# Patient Record
Sex: Male | Born: 1970 | Hispanic: Yes | Marital: Married | Smoking: Never smoker
Health system: Southern US, Community
[De-identification: ages and names within clinical notes are randomized; demographics above are authoritative.]

## PROBLEM LIST (undated history)

## (undated) DIAGNOSIS — F32A Depression, unspecified: Secondary | ICD-10-CM

## (undated) DIAGNOSIS — R58 Hemorrhage, not elsewhere classified: Secondary | ICD-10-CM

## (undated) DIAGNOSIS — R519 Headache, unspecified: Secondary | ICD-10-CM

## (undated) DIAGNOSIS — R309 Painful micturition, unspecified: Secondary | ICD-10-CM

## (undated) DIAGNOSIS — D649 Anemia, unspecified: Secondary | ICD-10-CM

## (undated) DIAGNOSIS — K219 Gastro-esophageal reflux disease without esophagitis: Secondary | ICD-10-CM

## (undated) DIAGNOSIS — E119 Type 2 diabetes mellitus without complications: Secondary | ICD-10-CM

## (undated) DIAGNOSIS — F419 Anxiety disorder, unspecified: Secondary | ICD-10-CM

## (undated) DIAGNOSIS — R351 Nocturia: Secondary | ICD-10-CM

## (undated) DIAGNOSIS — I10 Essential (primary) hypertension: Secondary | ICD-10-CM

## (undated) HISTORY — DX: Headache, unspecified: R51.9

## (undated) HISTORY — DX: Hemorrhage, not elsewhere classified: R58

## (undated) HISTORY — DX: Gastro-esophageal reflux disease without esophagitis: K21.9

## (undated) HISTORY — DX: Essential (primary) hypertension: I10

## (undated) HISTORY — DX: Anxiety disorder, unspecified: F41.9

## (undated) HISTORY — DX: Anemia, unspecified: D64.9

## (undated) HISTORY — DX: Depression, unspecified: F32.A

## (undated) HISTORY — DX: Nocturia: R35.1

## (undated) HISTORY — DX: Painful micturition, unspecified: R30.9

## (undated) HISTORY — DX: Type 2 diabetes mellitus without complications: E11.9

---

## 2014-03-05 ENCOUNTER — Telehealth: Payer: Self-pay | Admitting: *Deleted

## 2014-03-05 ENCOUNTER — Ambulatory Visit: Payer: Self-pay | Admitting: Podiatry

## 2014-03-05 ENCOUNTER — Encounter: Payer: Self-pay | Admitting: Podiatry

## 2014-03-05 VITALS — BP 121/77 | HR 58 | Resp 16 | Ht 65.0 in | Wt 180.0 lb

## 2014-03-05 DIAGNOSIS — B353 Tinea pedis: Secondary | ICD-10-CM

## 2014-03-05 DIAGNOSIS — B351 Tinea unguium: Secondary | ICD-10-CM

## 2014-03-05 DIAGNOSIS — L600 Ingrowing nail: Secondary | ICD-10-CM

## 2014-03-05 MED ORDER — TERBINAFINE HCL 250 MG PO TABS: 250.0000 mg | ORAL_TABLET | Freq: Every day | ORAL | Status: DC

## 2014-03-05 MED ORDER — NEOMYCIN-POLYMYXIN-HC 1 % OT SOLN: OTIC | Status: DC

## 2014-03-05 NOTE — Progress Notes (Signed)
   Subjective:    Patient ID: Darrell Turner, male    DOB: 11/14/1970, 43 y.o.   MRN: [id number]  HPI Comments: "He has yellow nails"  Patient presents with wife  Patient c/o thick, discolored toenails for several years. First toes bilateral are ingrown along the medial border. Those areas get red and swollen and very painful. He has tried several OTC antifungals-no help.      Review of Systems  Skin:       Change in nails  All other systems reviewed and are negative.      Objective:   Physical Exam: I have reviewed his past medical history medications allergies surgeries social history and review of systems. Pulses are strongly palpable bilateral deep tendon reflexes are intact bilateral neurologic sensorium is intact per since once the monofilament. Muscle strength is 5 over 5 dorsiflexors plantar flexors inverters everters all intrinsic musculature appears to be intact. Orthopedic evaluation demonstrates all joints distal to the ankle a full range of motion without crepitation. He does have sharp incurvated nail margin along the tibial border of the hallux bilateral. He also has thick yellow dystrophic clinically mycotic nails which are painful for him. Tinea pedis bilateral.        Assessment & Plan:  Assessment: Ingrown nail paronychia hallux bilateral. Onychomycosis bilateral. Tinea pedis bilateral.  Plan: Discussed etiology pathology conservative versus surgical therapies. At this point we performed a chemical matrixectomy to the tibial border of the hallux bilaterally after local anesthetic was administered. He tolerated this procedure well and phenol was utilized. He was her soaking on a twice a day basis and Betadine water starting tomorrow. She will apply Cortisporin otic twice daily and cover twice daily. I wrote a prescription for Lamisil 250 mg once daily. I will followup with him in one week

## 2014-03-05 NOTE — Telephone Encounter (Signed)
Calling to verify a prescription that was sent to us.  It's for cortisporin otic solution, it's an ear drop but instructions say to apply to the toe.  Is this correct?  I informed her yes, it's correct.

## 2014-03-05 NOTE — Patient Instructions (Addendum)
Betadine Soak Instructions  Purchase an 8 oz. bottle of BETADINE solution (Povidone)  THE DAY AFTER THE PROCEDURE  Place 1 tablespoon of betadine solution in a quart of warm tap water.  Submerge your foot or feet with outer bandage intact for the initial soak; this will allow the bandage to become moist and wet for easy lift off.  Once you remove your bandage, continue to soak in the solution for 20 minutes.  This soak should be done twice a day.  Next, remove your foot or feet from solution, blot dry the affected area and cover.  You may use a band aid large enough to cover the area or use gauze and tape.  Apply other medications to the area as directed by the doctor such as cortisporin otic solution (ear drops) or neosporin.  IF YOUR SKIN BECOMES IRRITATED WHILE USING THESE INSTRUCTIONS, IT IS OKAY TO SWITCH TO EPSOM SALTS AND WATER OR WHITE VINEGAR AND WATER.   Terbinafine tablets What is this medicine? TERBINAFINE (TER bin a feen) is an antifungal medicine. It is used to treat certain kinds of fungal or yeast infections. This medicine may be used for other purposes; ask your health care provider or pharmacist if you have questions. COMMON BRAND NAME(S): Lamisil, Terbinex What should I tell my health care provider before I take this medicine? They need to know if you have any of these conditions: -drink alcoholic beverages -kidney disease -liver disease -an unusual or allergic reaction to terbinafine, other medicines, foods, dyes, or preservatives -pregnant or trying to get pregnant -breast-feeding How should I use this medicine? Take this medicine by mouth with a full glass of water. Follow the directions on the prescription label. You can take this medicine with food or on an empty stomach. Take your medicine at regular intervals. Do not take your medicine more often than directed. Do not skip doses or stop your medicine early even if you feel better. Do not stop taking except on your  doctor's advice. Talk to your pediatrician regarding the use of this medicine in children. Special care may be needed. Overdosage: If you think you have taken too much of this medicine contact a poison control center or emergency room at once. NOTE: This medicine is only for you. Do not share this medicine with others. What if I miss a dose? If you miss a dose, take it as soon as you can. If it is almost time for your next dose, take only that dose. Do not take double or extra doses. What may interact with this medicine? Do not take this medicine with any of the following medications: -thioridazine This medicine may also interact with the following medications: -beta-blockers -caffeine -cimetidine -cyclosporine -medicines for depression, anxiety, or psychotic disturbances -medicines for fungal infections like fluconazole and ketoconazole -medicines for irregular heartbeat like amiodarone, flecainide and propafenone -rifampin -warfarin This list may not describe all possible interactions. Give your health care provider a list of all the medicines, herbs, non-prescription drugs, or dietary supplements you use. Also tell them if you smoke, drink alcohol, or use illegal drugs. Some items may interact with your medicine. What should I watch for while using this medicine? Visit your doctor or health care provider regularly. Tell your doctor right away if you have nausea or vomiting, loss of appetite, stomach pain on your right upper side, yellow skin, dark urine, light stools, or are over tired. Some fungal infections need many weeks or months of treatment to cure. If you  are taking this medicine for a long time, you will need to have important blood work done. What side effects may I notice from receiving this medicine? Side effects that you should report to your doctor or health care professional as soon as possible: -allergic reactions like skin rash or hives, swelling of the face, lips, or  tongue -changes in vision -dark urine -fever or infection -general ill feeling or flu-like symptoms -light-colored stools -loss of appetite, nausea -redness, blistering, peeling or loosening of the skin, including inside the mouth -right upper belly pain -unusually weak or tired -yellowing of the eyes or skin Side effects that usually do not require medical attention (report to your doctor or health care professional if they continue or are bothersome): -changes in taste -diarrhea -hair loss -muscle or joint pain -stomach gas -stomach upset This list may not describe all possible side effects. Call your doctor for medical advice about side effects. You may report side effects to FDA at 1-800-FDA-1088. Where should I keep my medicine? Keep out of the reach of children. Store at room temperature below 25 degrees C (77 degrees F). Protect from light. Throw away any unused medicine after the expiration date. NOTE: This sheet is a summary. It may not cover all possible information. If you have questions about this medicine, talk to your doctor, pharmacist, or health care provider.  2014, [employer] Standard. (2007-12-27 16:28:07)

## 2014-03-12 ENCOUNTER — Encounter (HOSPITAL_COMMUNITY): Payer: Self-pay | Admitting: Emergency Medicine

## 2014-03-12 DIAGNOSIS — K3580 Unspecified acute appendicitis: Principal | ICD-10-CM | POA: Insufficient documentation

## 2014-03-12 LAB — COMPREHENSIVE METABOLIC PANEL
ALK PHOS: 74 U/L (ref 39–117)
ALT: 28 U/L (ref 0–53)
AST: 24 U/L (ref 0–37)
Albumin: 4.1 g/dL (ref 3.5–5.2)
BUN: 16 mg/dL (ref 6–23)
CO2: 22 meq/L (ref 19–32)
Calcium: 9.6 mg/dL (ref 8.4–10.5)
Chloride: 98 mEq/L (ref 96–112)
Creatinine, Ser: 0.99 mg/dL (ref 0.50–1.35)
GLUCOSE: 118 mg/dL — AB (ref 70–99)
Potassium: 3.8 mEq/L (ref 3.7–5.3)
SODIUM: 136 meq/L — AB (ref 137–147)
Total Bilirubin: 0.5 mg/dL (ref 0.3–1.2)
Total Protein: 7.7 g/dL (ref 6.0–8.3)

## 2014-03-12 LAB — URINALYSIS, ROUTINE W REFLEX MICROSCOPIC
Bilirubin Urine: NEGATIVE
GLUCOSE, UA: NEGATIVE mg/dL
HGB URINE DIPSTICK: NEGATIVE
Ketones, ur: NEGATIVE mg/dL
Leukocytes, UA: NEGATIVE
Nitrite: NEGATIVE
Protein, ur: NEGATIVE mg/dL
SPECIFIC GRAVITY, URINE: 1.027 (ref 1.005–1.030)
Urobilinogen, UA: 1 mg/dL (ref 0.0–1.0)
pH: 5.5 (ref 5.0–8.0)

## 2014-03-12 LAB — CBC WITH DIFFERENTIAL/PLATELET
BASOS ABS: 0 10*3/uL (ref 0.0–0.1)
Basophils Relative: 0 % (ref 0–1)
EOS ABS: 0.1 10*3/uL (ref 0.0–0.7)
EOS PCT: 1 % (ref 0–5)
HEMATOCRIT: 48.5 % (ref 39.0–52.0)
Hemoglobin: 17.3 g/dL — ABNORMAL HIGH (ref 13.0–17.0)
LYMPHS PCT: 12 % (ref 12–46)
Lymphs Abs: 2.4 10*3/uL (ref 0.7–4.0)
MCH: 31.6 pg (ref 26.0–34.0)
MCHC: 35.7 g/dL (ref 30.0–36.0)
MCV: 88.7 fL (ref 78.0–100.0)
MONO ABS: 0.7 10*3/uL (ref 0.1–1.0)
Monocytes Relative: 4 % (ref 3–12)
Neutro Abs: 17.3 10*3/uL — ABNORMAL HIGH (ref 1.7–7.7)
Neutrophils Relative %: 83 % — ABNORMAL HIGH (ref 43–77)
Platelets: 244 10*3/uL (ref 150–400)
RBC: 5.47 MIL/uL (ref 4.22–5.81)
RDW: 12.5 % (ref 11.5–15.5)
WBC: 20.6 10*3/uL — ABNORMAL HIGH (ref 4.0–10.5)

## 2014-03-12 LAB — LIPASE, BLOOD: Lipase: 22 U/L (ref 11–59)

## 2014-03-12 NOTE — ED Notes (Signed)
Pt c/o right lower abdominal pain. Pt states he was mowing lawns today when the pain started. Pt reports some nausea, no dysuria, tender to palpation, pain radiates to right lower back, last BM today and normal

## 2014-03-13 ENCOUNTER — Encounter (HOSPITAL_COMMUNITY): Payer: Medicaid Other | Admitting: Anesthesiology

## 2014-03-13 ENCOUNTER — Observation Stay (HOSPITAL_COMMUNITY)
Admission: EM | Admit: 2014-03-13 | Discharge: 2014-03-14 | Disposition: A | Discharge status: Home / Self Care | Payer: Medicaid Other | Attending: General Surgery | Admitting: General Surgery

## 2014-03-13 ENCOUNTER — Encounter (HOSPITAL_COMMUNITY)
Admission: EM | Disposition: A | Discharge status: Home / Self Care | Payer: Self-pay | Source: Home / Self Care | Attending: Emergency Medicine

## 2014-03-13 ENCOUNTER — Encounter (HOSPITAL_COMMUNITY): Payer: Self-pay | Admitting: Radiology

## 2014-03-13 ENCOUNTER — Emergency Department (HOSPITAL_COMMUNITY): Payer: Medicaid Other

## 2014-03-13 ENCOUNTER — Emergency Department (HOSPITAL_COMMUNITY): Payer: Medicaid Other | Admitting: Anesthesiology

## 2014-03-13 DIAGNOSIS — Z9049 Acquired absence of other specified parts of digestive tract: Secondary | ICD-10-CM

## 2014-03-13 DIAGNOSIS — K3580 Unspecified acute appendicitis: Secondary | ICD-10-CM

## 2014-03-13 HISTORY — PX: LAPAROSCOPIC APPENDECTOMY: SHX408

## 2014-03-13 SURGERY — APPENDECTOMY, LAPAROSCOPIC
Anesthesia: General | Site: Abdomen

## 2014-03-13 MED ORDER — ONDANSETRON HCL 4 MG/2ML IJ SOLN
INTRAMUSCULAR | Status: DC | PRN
Start: 2014-03-13 — End: 2014-03-13
  Administered 2014-03-13: 4 mg via INTRAVENOUS

## 2014-03-13 MED ORDER — OXYCODONE HCL 5 MG PO TABS
5.0000 mg | ORAL_TABLET | ORAL | Status: DC | PRN
  Administered 2014-03-13: 5 mg via ORAL
  Filled 2014-03-13: qty 1

## 2014-03-13 MED ORDER — MIDAZOLAM HCL 2 MG/2ML IJ SOLN
INTRAMUSCULAR | Status: AC
  Filled 2014-03-13: qty 2

## 2014-03-13 MED ORDER — OXYCODONE HCL 5 MG PO TABS
5.0000 mg | ORAL_TABLET | Freq: Once | ORAL | Status: AC | PRN
  Administered 2014-03-13: 5 mg via ORAL

## 2014-03-13 MED ORDER — GLYCOPYRROLATE 0.2 MG/ML IJ SOLN
INTRAMUSCULAR | Status: AC
  Filled 2014-03-13: qty 2

## 2014-03-13 MED ORDER — ROCURONIUM BROMIDE 100 MG/10ML IV SOLN
INTRAVENOUS | Status: DC | PRN
  Administered 2014-03-13: 25 mg via INTRAVENOUS

## 2014-03-13 MED ORDER — IOHEXOL 300 MG/ML  SOLN
100.0000 mL | Freq: Once | INTRAMUSCULAR | Status: AC | PRN
  Administered 2014-03-13: 100 mL via INTRAVENOUS

## 2014-03-13 MED ORDER — ACETAMINOPHEN 325 MG PO TABS: 650.0000 mg | ORAL_TABLET | Freq: Four times a day (QID) | ORAL | Status: DC | PRN

## 2014-03-13 MED ORDER — MORPHINE SULFATE 2 MG/ML IJ SOLN
2.0000 mg | INTRAMUSCULAR | Status: DC | PRN
  Administered 2014-03-13 – 2014-03-14 (×2): 2 mg via INTRAVENOUS
  Filled 2014-03-13 (×2): qty 1

## 2014-03-13 MED ORDER — ONDANSETRON HCL 4 MG/2ML IJ SOLN: 4.0000 mg | Freq: Four times a day (QID) | INTRAMUSCULAR | Status: DC | PRN

## 2014-03-13 MED ORDER — SODIUM CHLORIDE 0.9 % IR SOLN
Status: DC | PRN
  Administered 2014-03-13: 1000 mL

## 2014-03-13 MED ORDER — PIPERACILLIN-TAZOBACTAM 3.375 G IVPB
3.3750 g | Freq: Three times a day (TID) | INTRAVENOUS | Status: AC
  Administered 2014-03-13 – 2014-03-14 (×3): 3.375 g via INTRAVENOUS
  Filled 2014-03-13 (×5): qty 50

## 2014-03-13 MED ORDER — OXYCODONE HCL 5 MG PO TABS
ORAL_TABLET | ORAL | Status: AC
  Administered 2014-03-13: 5 mg via ORAL
  Filled 2014-03-13: qty 1

## 2014-03-13 MED ORDER — LIDOCAINE HCL (CARDIAC) 20 MG/ML IV SOLN
INTRAVENOUS | Status: DC | PRN
  Administered 2014-03-13: 60 mg via INTRAVENOUS

## 2014-03-13 MED ORDER — LIDOCAINE HCL (CARDIAC) 20 MG/ML IV SOLN
INTRAVENOUS | Status: AC
  Filled 2014-03-13: qty 5

## 2014-03-13 MED ORDER — SODIUM CHLORIDE 0.9 % IV BOLUS (SEPSIS)
1000.0000 mL | Freq: Once | INTRAVENOUS | Status: AC
  Administered 2014-03-13: 1000 mL via INTRAVENOUS

## 2014-03-13 MED ORDER — LACTATED RINGERS IV SOLN
INTRAVENOUS | Status: DC
  Administered 2014-03-13 (×2): via INTRAVENOUS

## 2014-03-13 MED ORDER — SUCCINYLCHOLINE CHLORIDE 20 MG/ML IJ SOLN
INTRAMUSCULAR | Status: DC | PRN
  Administered 2014-03-13: 100 mg via INTRAVENOUS

## 2014-03-13 MED ORDER — PROPOFOL 10 MG/ML IV BOLUS
INTRAVENOUS | Status: AC
  Filled 2014-03-13: qty 20

## 2014-03-13 MED ORDER — IOHEXOL 300 MG/ML  SOLN: 25.0000 mL | INTRAMUSCULAR | Status: DC

## 2014-03-13 MED ORDER — ONDANSETRON HCL 4 MG/2ML IJ SOLN
INTRAMUSCULAR | Status: AC
  Filled 2014-03-13: qty 2

## 2014-03-13 MED ORDER — GLYCOPYRROLATE 0.2 MG/ML IJ SOLN
INTRAMUSCULAR | Status: DC | PRN
  Administered 2014-03-13: 0.4 mg via INTRAVENOUS

## 2014-03-13 MED ORDER — OXYCODONE HCL 5 MG/5ML PO SOLN: 5.0000 mg | Freq: Once | ORAL | Status: AC | PRN

## 2014-03-13 MED ORDER — NEOSTIGMINE METHYLSULFATE 10 MG/10ML IV SOLN
INTRAVENOUS | Status: DC | PRN
  Administered 2014-03-13: 3 mg via INTRAVENOUS

## 2014-03-13 MED ORDER — FENTANYL CITRATE 0.05 MG/ML IJ SOLN
INTRAMUSCULAR | Status: DC | PRN
  Administered 2014-03-13: 150 ug via INTRAVENOUS

## 2014-03-13 MED ORDER — KCL IN DEXTROSE-NACL 20-5-0.45 MEQ/L-%-% IV SOLN
INTRAVENOUS | Status: DC
  Administered 2014-03-13 – 2014-03-14 (×2): via INTRAVENOUS
  Filled 2014-03-13 (×4): qty 1000

## 2014-03-13 MED ORDER — KCL IN DEXTROSE-NACL 20-5-0.45 MEQ/L-%-% IV SOLN
INTRAVENOUS | Status: DC
  Administered 2014-03-13: 999 mL via INTRAVENOUS
  Filled 2014-03-13 (×4): qty 1000

## 2014-03-13 MED ORDER — HYDROMORPHONE HCL PF 1 MG/ML IJ SOLN: 0.2500 mg | INTRAMUSCULAR | Status: DC | PRN

## 2014-03-13 MED ORDER — IOHEXOL 300 MG/ML  SOLN
25.0000 mL | Freq: Once | INTRAMUSCULAR | Status: AC | PRN
  Administered 2014-03-13: 25 mL via ORAL

## 2014-03-13 MED ORDER — MIDAZOLAM HCL 5 MG/5ML IJ SOLN
INTRAMUSCULAR | Status: DC | PRN
  Administered 2014-03-13: 2 mg via INTRAVENOUS

## 2014-03-13 MED ORDER — PIPERACILLIN-TAZOBACTAM 3.375 G IVPB
3.3750 g | Freq: Three times a day (TID) | INTRAVENOUS | Status: DC
  Administered 2014-03-13: 3.375 g via INTRAVENOUS
  Filled 2014-03-13: qty 50

## 2014-03-13 MED ORDER — NEOSTIGMINE METHYLSULFATE 10 MG/10ML IV SOLN
INTRAVENOUS | Status: AC
  Filled 2014-03-13: qty 1

## 2014-03-13 MED ORDER — BUPIVACAINE-EPINEPHRINE 0.25% -1:200000 IJ SOLN
INTRAMUSCULAR | Status: DC | PRN
  Administered 2014-03-13: 30 mL

## 2014-03-13 MED ORDER — MORPHINE SULFATE 4 MG/ML IJ SOLN
4.0000 mg | Freq: Once | INTRAMUSCULAR | Status: AC
  Administered 2014-03-13: 4 mg via INTRAVENOUS
  Filled 2014-03-13: qty 1

## 2014-03-13 MED ORDER — PROMETHAZINE HCL 25 MG/ML IJ SOLN: 6.2500 mg | INTRAMUSCULAR | Status: DC | PRN

## 2014-03-13 MED ORDER — BUPIVACAINE-EPINEPHRINE (PF) 0.25% -1:200000 IJ SOLN
INTRAMUSCULAR | Status: AC
  Filled 2014-03-13: qty 30

## 2014-03-13 MED ORDER — ONDANSETRON HCL 4 MG/2ML IJ SOLN
4.0000 mg | Freq: Once | INTRAMUSCULAR | Status: AC
  Administered 2014-03-13: 4 mg via INTRAVENOUS
  Filled 2014-03-13: qty 2

## 2014-03-13 MED ORDER — ACETAMINOPHEN 325 MG PO TABS
ORAL_TABLET | ORAL | Status: AC
  Administered 2014-03-13: 650 mg
  Filled 2014-03-13: qty 2

## 2014-03-13 MED ORDER — ACETAMINOPHEN 650 MG RE SUPP: 650.0000 mg | Freq: Four times a day (QID) | RECTAL | Status: DC | PRN

## 2014-03-13 MED ORDER — FENTANYL CITRATE 0.05 MG/ML IJ SOLN
INTRAMUSCULAR | Status: AC
  Filled 2014-03-13: qty 5

## 2014-03-13 MED ORDER — PROPOFOL 10 MG/ML IV BOLUS
INTRAVENOUS | Status: DC | PRN
  Administered 2014-03-13: 180 mg via INTRAVENOUS

## 2014-03-13 SURGICAL SUPPLY — 44 items
APPLIER CLIP ROT 10 11.4 M/L (STAPLE)
CANISTER SUCTION 2500CC (MISCELLANEOUS) ×3 IMPLANT
CHLORAPREP W/TINT 26ML (MISCELLANEOUS) ×3 IMPLANT
CLIP APPLIE ROT 10 11.4 M/L (STAPLE) IMPLANT
COVER SURGICAL LIGHT HANDLE (MISCELLANEOUS) ×3 IMPLANT
CUTTER FLEX LINEAR 45M (STAPLE) ×3 IMPLANT
DERMABOND ADVANCED (GAUZE/BANDAGES/DRESSINGS) ×2
DERMABOND ADVANCED .7 DNX12 (GAUZE/BANDAGES/DRESSINGS) ×1 IMPLANT
ELECT REM PT RETURN 9FT ADLT (ELECTROSURGICAL) ×3
ELECTRODE REM PT RTRN 9FT ADLT (ELECTROSURGICAL) ×1 IMPLANT
GLOVE BIO SURGEON STRL SZ7 (GLOVE) ×3 IMPLANT
GLOVE BIOGEL PI IND STRL 6.5 (GLOVE) ×1 IMPLANT
GLOVE BIOGEL PI IND STRL 7.0 (GLOVE) ×1 IMPLANT
GLOVE BIOGEL PI IND STRL 7.5 (GLOVE) ×1 IMPLANT
GLOVE BIOGEL PI IND STRL 8 (GLOVE) ×2 IMPLANT
GLOVE BIOGEL PI INDICATOR 6.5 (GLOVE) ×2
GLOVE BIOGEL PI INDICATOR 7.0 (GLOVE) ×2
GLOVE BIOGEL PI INDICATOR 7.5 (GLOVE) ×2
GLOVE BIOGEL PI INDICATOR 8 (GLOVE) ×4
GLOVE SS BIOGEL STRL SZ 7.5 (GLOVE) ×1 IMPLANT
GLOVE SUPERSENSE BIOGEL SZ 7.5 (GLOVE) ×2
GLOVE SURG SS PI 6.5 STRL IVOR (GLOVE) ×3 IMPLANT
GOWN STRL REUS W/ TWL LRG LVL3 (GOWN DISPOSABLE) ×3 IMPLANT
GOWN STRL REUS W/TWL LRG LVL3 (GOWN DISPOSABLE) ×6
KIT BASIN OR (CUSTOM PROCEDURE TRAY) ×3 IMPLANT
KIT ROOM TURNOVER OR (KITS) ×3 IMPLANT
NS IRRIG 1000ML POUR BTL (IV SOLUTION) ×3 IMPLANT
PAD ARMBOARD 7.5X6 YLW CONV (MISCELLANEOUS) ×6 IMPLANT
POUCH RETRIEVAL ECOSAC 10 (ENDOMECHANICALS) ×1 IMPLANT
POUCH RETRIEVAL ECOSAC 10MM (ENDOMECHANICALS) ×2
RELOAD 45 VASCULAR/THIN (ENDOMECHANICALS) ×3 IMPLANT
RELOAD STAPLE TA45 3.5 REG BLU (ENDOMECHANICALS) IMPLANT
SCALPEL HARMONIC ACE (MISCELLANEOUS) ×3 IMPLANT
SCISSORS LAP 5X35 DISP (ENDOMECHANICALS) IMPLANT
SET IRRIG TUBING LAPAROSCOPIC (IRRIGATION / IRRIGATOR) ×3 IMPLANT
SLEEVE ENDOPATH XCEL 5M (ENDOMECHANICALS) ×3 IMPLANT
SPECIMEN JAR SMALL (MISCELLANEOUS) ×3 IMPLANT
SUT MNCRL AB 4-0 PS2 18 (SUTURE) ×3 IMPLANT
TOWEL OR 17X24 6PK STRL BLUE (TOWEL DISPOSABLE) ×3 IMPLANT
TOWEL OR 17X26 10 PK STRL BLUE (TOWEL DISPOSABLE) ×3 IMPLANT
TRAY FOLEY CATH 16FR SILVER (SET/KITS/TRAYS/PACK) ×3 IMPLANT
TRAY LAPAROSCOPIC (CUSTOM PROCEDURE TRAY) ×3 IMPLANT
TROCAR XCEL BLUNT TIP 100MML (ENDOMECHANICALS) ×3 IMPLANT
TROCAR XCEL NON-BLD 5MMX100MML (ENDOMECHANICALS) ×3 IMPLANT

## 2014-03-13 NOTE — Anesthesia Preprocedure Evaluation (Addendum)
Anesthesia Evaluation  Patient identified by MRN, date of birth, ID band Patient awake    Reviewed: Allergy & Precautions, H&P , NPO status , Patient's Chart, lab work & pertinent test results  History of Anesthesia Complications Negative for: history of anesthetic complications  Airway Mallampati: I TM Distance: >3 FB Neck ROM: full    Dental no notable dental hx. (+) Teeth Intact, Dental Advisory Given   Pulmonary neg pulmonary ROS,  breath sounds clear to auscultation  Pulmonary exam normal       Cardiovascular negative cardio ROS  IRhythm:regular Rate:Normal     Neuro/Psych negative neurological ROS  negative psych ROS   GI/Hepatic negative GI ROS, Neg liver ROS,   Endo/Other  negative endocrine ROS  Renal/GU negative Renal ROS  negative genitourinary   Musculoskeletal   Abdominal   Peds  Hematology negative hematology ROS (+)   Anesthesia Other Findings   Reproductive/Obstetrics negative OB ROS                          Anesthesia Physical Anesthesia Plan  ASA: I  Anesthesia Plan: General and General ETT   Post-op Pain Management:    Induction: Intravenous  Airway Management Planned: Oral ETT  Additional Equipment:   Intra-op Plan:   Post-operative Plan: Extubation in OR  Informed Consent: I have reviewed the patients History and Physical, chart, labs and discussed the procedure including the risks, benefits and alternatives for the proposed anesthesia with the patient or authorized representative who has indicated his/her understanding and acceptance.   Dental advisory given  Plan Discussed with: CRNA, Surgeon and Anesthesiologist  Anesthesia Plan Comments:        Anesthesia Quick Evaluation

## 2014-03-13 NOTE — Anesthesia Procedure Notes (Signed)
Procedure Name: Intubation Date/Time: 03/13/2014 10:26 AM Performed by: Charm BargesBUTLER, Darrell Turner Pre-anesthesia Checklist: Patient identified, Emergency Drugs available, Suction available, Patient being monitored and Timeout performed Patient Re-evaluated:Patient Re-evaluated prior to inductionOxygen Delivery Method: Circle system utilized Preoxygenation: Pre-oxygenation with 100% oxygen Intubation Type: IV induction Ventilation: Mask ventilation without difficulty Laryngoscope Size: Mac and 4 Grade View: Grade I Tube type: Oral Tube size: 7.5 mm Number of attempts: 1 Airway Equipment and Method: Stylet Placement Confirmation: ETT inserted through vocal cords under direct vision,  positive ETCO2 and breath sounds checked- equal and bilateral Secured at: 22 cm Tube secured with: Tape Dental Injury: Teeth and Oropharynx as per pre-operative assessment

## 2014-03-13 NOTE — ED Notes (Signed)
Called CT to let them know patient is ready for transport.  CT will bring contrast to drink.

## 2014-03-13 NOTE — ED Notes (Signed)
Called ED Pharmacist, Clydie BraunKaren, to have NaCL with 20 of K verified and sent.

## 2014-03-13 NOTE — Progress Notes (Signed)
Patient ID: Darrell CroonJose Mullenbach, male   DOB: 05/20/1971, 43 y.o.   MRN: [id number] rlq pain for 24 hours, ct with appendicitis, examined patient, reviewed labs and imaging, plan for lap appy this am when or available.  Risks discussed.

## 2014-03-13 NOTE — Anesthesia Postprocedure Evaluation (Signed)
  Anesthesia Post-op Note  Patient: Darrell Turner  Procedure(s) Performed: Procedure(s): APPENDECTOMY LAPAROSCOPIC (N/A)  Patient Location: PACU  Anesthesia Type:General  Level of Consciousness: awake, alert  and oriented  Airway and Oxygen Therapy: Patient Spontanous Breathing and Patient connected to nasal cannula oxygen  Post-op Pain: mild  Post-op Assessment: Post-op Vital signs reviewed  Post-op Vital Signs: Reviewed  Last Vitals:  Filed Vitals:   03/13/14 1200  BP: 119/78  Pulse: 60  Temp:   Resp: 18    Complications: No apparent anesthesia complications

## 2014-03-13 NOTE — ED Notes (Signed)
Spoke with Dr. Ranae PalmsYelverton, patient will be going to the OR, then the floor.

## 2014-03-13 NOTE — ED Provider Notes (Signed)
CSN: [id number]     Arrival date & time 03/12/14  2139 History   First MD Initiated Contact with Patient 03/13/14 0037     Chief Complaint  Patient presents with  . Abdominal Pain     (Consider location/radiation/quality/duration/timing/severity/associated sxs/prior Treatment) HPI Patient presents with gradual onset right lower quadrant pain. It started roughly at 3 PM earlier today. The pain is progressive and worse with movement or palpation. He denies any fevers or chills. He does report some nausea without vomiting. Admits to anorexia has not eaten for 12 hours. He denies any urinary symptoms. He has no past medical history and denies any abdominal surgeries in the past. History reviewed. No pertinent past medical history. History reviewed. No pertinent past surgical history. History reviewed. No pertinent family history. History  Substance Use Topics  . Smoking status: Never Smoker   . Smokeless tobacco: Not on file  . Alcohol Use: Yes     Comment: case of beer a week    Review of Systems  Constitutional: Negative for fever and chills.  Respiratory: Negative for shortness of breath.   Cardiovascular: Negative for chest pain.  Gastrointestinal: Positive for nausea and abdominal pain. Negative for vomiting, diarrhea and constipation.  Genitourinary: Negative for dysuria, flank pain, scrotal swelling and testicular pain.  Musculoskeletal: Negative for back pain.  Skin: Negative for rash and wound.  Neurological: Negative for dizziness, weakness, light-headedness, numbness and headaches.  All other systems reviewed and are negative.     Allergies  Review of patient's allergies indicates no known allergies.  Home Medications   Prior to Admission medications   Medication Sig Start Date End Date Taking? Authorizing Provider  NEOMYCIN-POLYMYXIN-HYDROCORTISONE (CORTISPORIN) 1 % SOLN otic solution Apply 2 drops to toe BID 03/05/14   Max T Hyatt, DPM  terbinafine (LAMISIL) 250 MG  tablet Take 1 tablet (250 mg total) by mouth daily. 03/05/14   Max T Hyatt, DPM   BP 128/86  Pulse 54  Temp(Src) 98 F (36.7 C) (Oral)  Resp 18  SpO2 97% Physical Exam  Nursing note and vitals reviewed. Constitutional: He is oriented to person, place, and time. He appears well-developed and well-nourished. No distress.  HENT:  Head: Normocephalic and atraumatic.  Mouth/Throat: Oropharynx is clear and moist.  Eyes: EOM are normal. Pupils are equal, round, and reactive to light.  Neck: Normal range of motion. Neck supple.  Cardiovascular: Normal rate and regular rhythm.   Pulmonary/Chest: Effort normal and breath sounds normal. No respiratory distress. He has no wheezes. He has no rales. He exhibits no tenderness.  Abdominal: Soft. Bowel sounds are normal. He exhibits no distension and no mass. There is tenderness. There is rebound. There is no guarding.  Right lower quadrant tenderness to palpation. Positive rebound tenderness and Rovsing sign.  Genitourinary:  No testicular masses or pain. No inguinal masses.  Musculoskeletal: Normal range of motion. He exhibits no edema and no tenderness.  No CVA tenderness.  Neurological: He is alert and oriented to person, place, and time.  Skin: Skin is warm and dry. No rash noted. No erythema.  Psychiatric: He has a normal mood and affect. His behavior is normal.    ED Course  Procedures (including critical care time) Labs Review Labs Reviewed  CBC WITH DIFFERENTIAL - Abnormal; Notable for the following:    WBC 20.6 (*)    Hemoglobin 17.3 (*)    Neutrophils Relative % 83 (*)    Neutro Abs 17.3 (*)    All other  components within normal limits  COMPREHENSIVE METABOLIC PANEL - Abnormal; Notable for the following:    Sodium 136 (*)    Glucose, Bld 118 (*)    All other components within normal limits  LIPASE, BLOOD  URINALYSIS, ROUTINE W REFLEX MICROSCOPIC    Imaging Review Ct Abdomen Pelvis W Contrast  03/13/2014   CLINICAL DATA:   Right lower abdominal pain. Nausea. Tenderness. Pain radiating to the lower back.  EXAM: CT ABDOMEN AND PELVIS WITH CONTRAST  TECHNIQUE: Multidetector CT imaging of the abdomen and pelvis was performed using the standard protocol following bolus administration of intravenous contrast.  CONTRAST:  100mL OMNIPAQUE IOHEXOL 300 MG/ML  SOLN  COMPARISON:  None.  FINDINGS: Dependent atelectasis in the lung bases.  The liver, spleen, gallbladder, pancreas, adrenal glands, kidneys, abdominal aorta, inferior vena cava, and retroperitoneal lymph nodes are unremarkable. Stomach and small bowel are mostly decompressed. Stool-filled colon without distention. No free air or free fluid in the abdomen.  Pelvis: Prostate gland is not enlarged. Bladder wall is not thickened. Stool-filled rectosigmoid colon without inflammatory change. Appendix is mildly dilated and fluid-filled with diameter ranging from 8-10 mm. Suggestion of an at appendicolith at the appendiceal tip. Early tip appendicitis is not excluded. There is no significant infiltration around the appendix and no abscess is identified. No destructive bone lesions are appreciated.  IMPRESSION: Mild bowel attenuation of the appendix, most prominent at the tip of the possible at the neck with at the tip. No surrounding inflammation or abscess. Changes likely represent early acute appendicitis, possibly tip appendicitis.   Electronically Signed   By: Burman NievesWilliam  Stevens M.D.   On: 03/13/2014 03:39     EKG Interpretation None      MDM   Final diagnoses:  Acute appendicitis    Suspect appendicitis. We'll keep the patient n.p.o. and treat symptomatically while acquiring CT scan.  Discuss CT results with Dr. Lindie SpruceWyatt. Will see patient in emergency department and admit.  Loren Raceravid Maccoy Haubner, MD 03/13/14 [phone]

## 2014-03-13 NOTE — Transfer of Care (Signed)
Immediate Anesthesia Transfer of Care Note  Patient: Darrell CroonJose Haverstock  Procedure(s) Performed: Procedure(s): APPENDECTOMY LAPAROSCOPIC (N/A)  Patient Location: PACU  Anesthesia Type:General  Level of Consciousness: awake  Airway & Oxygen Therapy: Patient Spontanous Breathing and Patient connected to nasal cannula oxygen  Post-op Assessment: Report given to PACU RN, Post -op Vital signs reviewed and stable and Patient moving all extremities  Post vital signs: Reviewed and stable  Complications: No apparent anesthesia complications

## 2014-03-13 NOTE — Op Note (Signed)
Preoperative diagnosis:Acute appendicitis Postoperative diagnosis: same as above Procedure: Laparoscopic appendectomy Surgeon: Dr Darrell [name] Anesthesia: general EBL: minimal Complications none Drains: none Specimen: appendix to pathology Sponge count correct at completion Disposition to recovery stable  Indications: This is a [age] yom who has rlq pain, elevated wbc and ct consistent with appendicitis.  I discussed laparoscopic appendectomy with risks/benefits.  Procedure: After informed consent was obtained, patient was taken to the operating room.  He was on zosyn already.  Sequential compression devices were in place.  He was then placed under general anesthesia without complication. His abdomen was prepped and draped in the standard sterile surgical fashion. A surgical timeout was then performed. A Foley catheter was placed.  I injected Marcaine below his umbilicus. I then made a vertical incision. I carried this to the fascia. The fascia was entered sharply. The peritoneum was entered bluntly. I then placed a 0 Vicryl pursestring suture to the fascia. A Hassan trocar was inserted and the abdomen was then insufflated to 15 mmHg pressure.I then inserted two further 5 mm trocars in the suprapubic region and left lower quadrant. These were done under direct vision without complication. His appendix is noted to be acutely supparative. I divided the mesentery with a Harmonic scalpel. I then divided the appendix at the base on the cecum with the GIA stapler. This was viable. The staple line was hemostatic upon completion. The appendix was placed in a bag and removed from the umbilicus. Irrigation was performed. This was clear. I then removed the umbilical trocar and tied down my suture completely obliterating the umbilical defect. Then desufflated the abdomen and removed the remaining trocars. These were closed with 4-0 Monocryl and Dermabond. He was extubated and transferred to recovery stable.

## 2014-03-13 NOTE — Progress Notes (Signed)
Care of pt assumed by MA Kemyra August RN 

## 2014-03-13 NOTE — Discharge Instructions (Signed)
Your appointment is at 2:00pm, please arrive at least 30 min before your appointment to complete your check in paperwork.  If you are unable to arrive 30 min prior to your appointment time we may have to cancel or reschedule you.  [facility] ______[facility] LAPAROSCOPIC SURGERY: POST OP INSTRUCTIONS Always review your discharge instruction sheet given to you by the facility where your surgery was performed. IF YOU HAVE DISABILITY OR FAMILY LEAVE FORMS, YOU MUST BRING THEM TO THE OFFICE FOR PROCESSING.   DO NOT GIVE THEM TO YOUR DOCTOR.  1. A prescription for pain medication may be given to you upon discharge.  Take your pain medication as prescribed, if needed.  If narcotic pain medicine is not needed, then you may take acetaminophen (Tylenol) or ibuprofen (Advil) as needed. 2. Take your usually prescribed medications unless otherwise directed. 3. If you need a refill on your pain medication, please contact your pharmacy.  They will contact our office to request authorization. Prescriptions will not be filled after 5pm or on week-ends. 4. You should follow a light diet the first few days after arrival home, such as soup and crackers, etc.  Be sure to include lots of fluids daily. 5. Most patients will experience some swelling and bruising in the area of the incisions.  Ice packs will help.  Swelling and bruising can take several days to resolve.  6. It is common to experience some constipation if taking pain medication after surgery.  Increasing fluid intake and taking a stool softener (such as Colace) will usually help or prevent this problem from occurring.  A mild laxative (Milk of Magnesia or Miralax) should be taken according to package instructions if there are no bowel movements after 48 hours. 7. Unless discharge instructions indicate otherwise, you may remove your bandages 24-48 hours after surgery, and you may shower at that time.  You may have steri-strips (small skin tapes) in  place directly over the incision.  These strips should be left on the skin for 7-10 days.  If your surgeon used skin glue on the incision, you may shower in 24 hours.  The glue will flake off over the next 2-3 weeks.  Any sutures or staples will be removed at the office during your follow-up visit. 8. ACTIVITIES:  You may resume regular (light) daily activities beginning the next day--such as daily self-care, walking, climbing stairs--gradually increasing activities as tolerated.  You may have sexual intercourse when it is comfortable.  Refrain from any heavy lifting or straining until approved by your doctor. a. You may drive when you are no longer taking prescription pain medication, you can comfortably wear a seatbelt, and you can safely maneuver your car and apply brakes. b. RETURN TO WORK:  __________________________________________________________ 9. You should see your doctor in the office for a follow-up appointment approximately 2-3 weeks after your surgery.  Make sure that you call for this appointment within a day or two after you arrive home to insure a convenient appointment time. 10. OTHER INSTRUCTIONS: __________________________________________________________________________________________________________________________ __________________________________________________________________________________________________________________________ WHEN TO CALL YOUR DOCTOR: 1. Fever over 101.0 2. Inability to urinate 3. Continued bleeding from incision. 4. Increased pain, redness, or drainage from the incision. 5. Increasing abdominal pain  The clinic staff is available to answer your questions during regular business hours.  Please dont hesitate to call and ask to speak to one of the nurses for clinical concerns.  If you have a medical emergency, go to the nearest emergency room or call 911.  A surgeon from [facility] is always on call at the hospital. [address], [address], [place], [place]  [address] ? [address] [address], [place], [place]   [address] [phone] ? [phone] ? FAX [phone] Web site: [email or web address]  CIRUGIA LAPAROSCOPICA: INSTRUCCIONES DE POST OPERATORIO.  Revise siempre los documentos que le entreguen en el lugar donde se ha hecho la [place].  SI USTED NECESITA DOCUMENTOS DE INCAPACIDAD (DISABLE) O DE PERMISO FAMILAR (FAMILY LEAVE) NECESITA TRAERLOS A LA OFICINA PARA QUE SEAN PROCESADOS. NO  SE LOS DE A SU DOCTOR. 11. A su alta del hospital se le dara una receta para Human resources officercontrolar el dolor. Tomela como ha sido recetada, si la necesita. Si no la necesita puede tomar, Acetaminofen (Tylenol) o Ibuprofen (Advil) para aliviar dolor moderado. 12. Continue tomando el resto de sus medicinas. 13. Si necesita rellenar la receta, llame a la farmacia. ellos contactan a nuestra oficina pidiendo autorizacion. Este tipo de receta no pueden ser PACCAR Increllenadas despues de las  5pm o Energy Transfer Partnersdurante los fines de [place]man[place]. 14. Con relacion a la dieta: debe ser El Paso Corporationligera los primeros dias despues que llege a la casa. Ejemplo: sopas y galleticas. Tome bastante liquido esos dias. 15. La mayoria de los pacientes padecen de inflamacion y cambio de coloracion de la piel alrededor de las incisiones. esto toma dias en resolver.  pnerse una bolsa de hielo en el area affectada ayuda..  16. Es comun tambien tener un poco de estrenimiento si esta tomado medicinas para Chief Technology Officerel dolor. incremente la cantidad de liquidos a tomar y Engineer, productionpuede tomar (Colace) esto previene el problema. Si ya tiene estrenimiento, es Designer, jewellerydecir no ha defecado en 48 horas, puede tomar un laxativo (Milk of Magnesia or Miralax) uselo como el paquete le explica. 17.  A menos que se le diga algo diferente. Remueva el bendaje a las 24-48 horas despues dela [place]. y puede banarse en la ducha sin ningun problema. usted puede tener steri-strips (pequenas curitas transparentes en la piel puesta encima de la incision)   Estas banditas strips should be left on the skin for 7-10 days.   Si su cirujano puso pegamento encima de la incision usted puede banarse bajo la ducha en 24 horas. Este pegamento empezara a caerse en las proximas 2-3 semanas. Si le pusieron suturas o presillas (grapos) estos seran quitados en su proxima cita en la oficina. Marland Kitchen. a. ACTIVIDADES:  Puede hacer actividad ligera.  Como caminar , subir escaleras y poco a poco irlas incrementando tanto como las Denisontolere. Puede tener relaciones sexuales cuando sea comfortable. No carge objetos pesados o haga esfuerzos que no sean aprovados por su doctor. b. Puede manejar en cuanto no esta tomando medicamentos fuertes (narcoticos) para Chief Technology Officerel dolor, pueda abrochar confortablemente el cinturon de seguridad, y pueda Personnel officermaniobrar y usar los pedales de su vehiculo con seguridad. c. PUEDE REGRESAR A TRABAJAR  18. Debe ver a su doctor para una cita de seguimiento en 2-3 semanas despues de la [place].  19. OTRAS ISNSTRUCCIONES:___________________________________________________________________________________ Darrell LacrosseUANDO LLAMAR A SU MEDICO: 6. FIEBRE mayor de  101.0 7. No produccion de [place]. 8. Sangramiento continue de la herida 9. Incremento de dolor, enrojecimientio o drenaje de la herida (incision) 10. Incremento de dolor abdominal.  The clinic staff is available to answer your questions during regular business hours.  Please dont hesitate to call and ask to speak to one of the nurses for clinical concerns.  If you have a medical emergency, go to the nearest emergency room or call 911.  A surgeon from [facility] is always on call at the hospital. [address], [address], [place], [place]  [address] ? [address] [address], [place], [place]   [address] [phone] ? [phone] ? FAX [phone] Web site: [email or web address]

## 2014-03-13 NOTE — ED Notes (Signed)
Called bed control about patient placement.

## 2014-03-13 NOTE — H&P (Signed)
Darrell Turner is an 43 y.o. male.   Chief Complaint: Abdominal pain with acute appendicitis on CT HPI: This patient started having generalized lower abdominal pain about 3 PM yesterday associated with nausea but no vomiting.  This started when the patient was at work and worsened to where he had to come to the ED for evaluation.   CT scan was done which demonstrates likely acute appendicitis.  Surgery was called.  Based on his history and physical there is a good chance that surgery could have been called prior to CT being done.  History reviewed. No pertinent past medical history.  History reviewed. No pertinent past surgical history.  History reviewed. No pertinent family history. Social History:  reports that he has never smoked. He does not have any smokeless tobacco history on file. He reports that he drinks alcohol. His drug history is not on file.  Allergies: No Known Allergies   (Not in a hospital admission)  Results for orders placed during the hospital encounter of 03/13/14 (from the past 48 hour(s))  CBC WITH DIFFERENTIAL     Status: Abnormal   Collection Time    03/12/14  9:54 PM      Result Value Ref Range   WBC 20.6 (*) 4.0 - 10.5 K/uL   RBC 5.47  4.22 - 5.81 MIL/uL   Hemoglobin 17.3 (*) 13.0 - 17.0 g/dL   HCT 48.5  39.0 - 52.0 %   MCV 88.7  78.0 - 100.0 fL   MCH 31.6  26.0 - 34.0 pg   MCHC 35.7  30.0 - 36.0 g/dL   RDW 12.5  11.5 - 15.5 %   Platelets 244  150 - 400 K/uL   Neutrophils Relative % 83 (*) 43 - 77 %   Neutro Abs 17.3 (*) 1.7 - 7.7 K/uL   Lymphocytes Relative 12  12 - 46 %   Lymphs Abs 2.4  0.7 - 4.0 K/uL   Monocytes Relative 4  3 - 12 %   Monocytes Absolute 0.7  0.1 - 1.0 K/uL   Eosinophils Relative 1  0 - 5 %   Eosinophils Absolute 0.1  0.0 - 0.7 K/uL   Basophils Relative 0  0 - 1 %   Basophils Absolute 0.0  0.0 - 0.1 K/uL  COMPREHENSIVE METABOLIC PANEL     Status: Abnormal   Collection Time    03/12/14  9:54 PM      Result Value Ref Range   Sodium  136 (*) 137 - 147 mEq/L   Potassium 3.8  3.7 - 5.3 mEq/L   Chloride 98  96 - 112 mEq/L   CO2 22  19 - 32 mEq/L   Glucose, Bld 118 (*) 70 - 99 mg/dL   BUN 16  6 - 23 mg/dL   Creatinine, Ser 0.99  0.50 - 1.35 mg/dL   Calcium 9.6  8.4 - 10.5 mg/dL   Total Protein 7.7  6.0 - 8.3 g/dL   Albumin 4.1  3.5 - 5.2 g/dL   AST 24  0 - 37 U/L   ALT 28  0 - 53 U/L   Alkaline Phosphatase 74  39 - 117 U/L   Total Bilirubin 0.5  0.3 - 1.2 mg/dL   GFR calc non Af Amer >90  >90 mL/min   GFR calc Af Amer >90  >90 mL/min   Comment: (NOTE)     The eGFR has been calculated using the CKD EPI equation.     This calculation has  not been validated in all clinical situations.     eGFR's persistently <90 mL/min signify possible Chronic Kidney     Disease.  LIPASE, BLOOD     Status: None   Collection Time    03/12/14  9:54 PM      Result Value Ref Range   Lipase 22  11 - 59 U/L  URINALYSIS, ROUTINE W REFLEX MICROSCOPIC     Status: None   Collection Time    03/12/14 10:32 PM      Result Value Ref Range   Color, Urine YELLOW  YELLOW   APPearance CLEAR  CLEAR   Specific Gravity, Urine 1.027  1.005 - 1.030   pH 5.5  5.0 - 8.0   Glucose, UA NEGATIVE  NEGATIVE mg/dL   Hgb urine dipstick NEGATIVE  NEGATIVE   Bilirubin Urine NEGATIVE  NEGATIVE   Ketones, ur NEGATIVE  NEGATIVE mg/dL   Protein, ur NEGATIVE  NEGATIVE mg/dL   Urobilinogen, UA 1.0  0.0 - 1.0 mg/dL   Nitrite NEGATIVE  NEGATIVE   Leukocytes, UA NEGATIVE  NEGATIVE   Comment: MICROSCOPIC NOT DONE ON URINES WITH NEGATIVE PROTEIN, BLOOD, LEUKOCYTES, NITRITE, OR GLUCOSE <1000 mg/dL.   Ct Abdomen Pelvis W Contrast  03/13/2014   CLINICAL DATA:  Right lower abdominal pain. Nausea. Tenderness. Pain radiating to the lower back.  EXAM: CT ABDOMEN AND PELVIS WITH CONTRAST  TECHNIQUE: Multidetector CT imaging of the abdomen and pelvis was performed using the standard protocol following bolus administration of intravenous contrast.  CONTRAST:  195m OMNIPAQUE  IOHEXOL 300 MG/ML  SOLN  COMPARISON:  None.  FINDINGS: Dependent atelectasis in the lung bases.  The liver, spleen, gallbladder, pancreas, adrenal glands, kidneys, abdominal aorta, inferior vena cava, and retroperitoneal lymph nodes are unremarkable. Stomach and small bowel are mostly decompressed. Stool-filled colon without distention. No free air or free fluid in the abdomen.  Pelvis: Prostate gland is not enlarged. Bladder wall is not thickened. Stool-filled rectosigmoid colon without inflammatory change. Appendix is mildly dilated and fluid-filled with diameter ranging from 8-10 mm. Suggestion of an at appendicolith at the appendiceal tip. Early tip appendicitis is not excluded. There is no significant infiltration around the appendix and no abscess is identified. No destructive bone lesions are appreciated.  IMPRESSION: Mild bowel attenuation of the appendix, most prominent at the tip of the possible at the neck with at the tip. No surrounding inflammation or abscess. Changes likely represent early acute appendicitis, possibly tip appendicitis.   Electronically Signed   By: WLucienne CapersM.D.   On: 03/13/2014 03:39    Review of Systems  Constitutional: Negative.   HENT: Negative.   Gastrointestinal: Positive for nausea and abdominal pain.  Skin: Negative.   All other systems reviewed and are negative.   Blood pressure 126/87, pulse 55, temperature 98 F (36.7 C), temperature source Oral, resp. rate 18, SpO2 96.00%. Physical Exam  Constitutional: He is oriented to person, place, and time. He appears well-developed and well-nourished.  HENT:  Head: Normocephalic and atraumatic.  Eyes: Conjunctivae and EOM are normal.  Neck: Normal range of motion. Neck supple.  Cardiovascular: Normal rate, regular rhythm and normal heart sounds.   Respiratory: Effort normal and breath sounds normal.  GI: Soft. Normal appearance and bowel sounds are normal. He exhibits no distension. There is tenderness in  the right lower quadrant. There is rebound, guarding and tenderness at McBurney's point. There is no rigidity.  Musculoskeletal: Normal range of motion.  Neurological: He is alert and  oriented to person, place, and time. He has normal reflexes.  Skin: Skin is warm and dry.  Psychiatric: He has a normal mood and affect. His behavior is normal. Judgment and thought content normal.     Assessment/Plan Acute appendicitis  IV antibiotics preoperatively Laparoscopic appendectomy this AM.  Gwenyth Ober 03/13/2014, 4:54 AM

## 2014-03-14 DIAGNOSIS — K3580 Unspecified acute appendicitis: Secondary | ICD-10-CM | POA: Diagnosis present

## 2014-03-14 MED ORDER — OXYCODONE HCL 5 MG PO TABS: 5.0000 mg | ORAL_TABLET | Freq: Four times a day (QID) | ORAL | Status: DC | PRN

## 2014-03-14 NOTE — Discharge Summary (Signed)
Physician Discharge Summary  Patient ID: Darrell Turner MRN: [id number] DOB/AGE: 41/10/1970 41 y.o.  Admit date: 03/13/2014 Discharge date: 03/14/2014  Admitting Diagnosis: Acute appendicitis  Discharge Diagnosis Patient Active Problem List   Diagnosis Date Noted  . Appendicitis, acute 03/14/2014  . S/P laparoscopic appendectomy 03/13/2014    Consultants None  Imaging: Ct Abdomen Pelvis W Contrast  03/13/2014   CLINICAL DATA:  Right lower abdominal pain. Nausea. Tenderness. Pain radiating to the lower back.  EXAM: CT ABDOMEN AND PELVIS WITH CONTRAST  TECHNIQUE: Multidetector CT imaging of the abdomen and pelvis was performed using the standard protocol following bolus administration of intravenous contrast.  CONTRAST:  100mL OMNIPAQUE IOHEXOL 300 MG/ML  SOLN  COMPARISON:  None.  FINDINGS: Dependent atelectasis in the lung bases.  The liver, spleen, gallbladder, pancreas, adrenal glands, kidneys, abdominal aorta, inferior vena cava, and retroperitoneal lymph nodes are unremarkable. Stomach and small bowel are mostly decompressed. Stool-filled colon without distention. No free air or free fluid in the abdomen.  Pelvis: Prostate gland is not enlarged. Bladder wall is not thickened. Stool-filled rectosigmoid colon without inflammatory change. Appendix is mildly dilated and fluid-filled with diameter ranging from 8-10 mm. Suggestion of an at appendicolith at the appendiceal tip. Early tip appendicitis is not excluded. There is no significant infiltration around the appendix and no abscess is identified. No destructive bone lesions are appreciated.  IMPRESSION: Mild bowel attenuation of the appendix, most prominent at the tip of the possible at the neck with at the tip. No surrounding inflammation or abscess. Changes likely represent early acute appendicitis, possibly tip appendicitis.   Electronically Signed   By: Burman NievesWilliam  Stevens M.D.   On: 03/13/2014 03:39    Procedures Dr. Dwain SarnaWakefield (03/13/14) -  Laparoscopic Appendectomy  Hospital Course:  43 y/o Hispanic [facility] with generalized lower abdominal pain about 3 PM yesterday associated with nausea but no vomiting. This started when the patient was at work and worsened to where he had to come to the ED for evaluation.  CT scan was done which demonstrates likely acute appendicitis. Surgery was called. Based on his history and physical there is a good chance that surgery could have been called prior to CT being done.  Patient was admitted and underwent procedure listed above.  Tolerated procedure well and was transferred to the floor.  Diet was advanced as tolerated.  On POD #1, the patient was voiding well, tolerating diet, ambulating well, pain well controlled, vital signs stable, incisions c/d/i and felt stable for discharge home.  Patient will follow up in our office in 3 weeks and knows to call with questions or concerns.  Keep your post op check with your podiatrist regarding your ingrown toe nail excision.  Physical Exam: General:  Alert, NAD, pleasant, comfortable Abd:  Soft, ND, mild tenderness, incisions C/D/I     Medication List         aspirin-acetaminophen-caffeine 250-250-65 MG per tablet  Commonly known as:  EXCEDRIN MIGRAINE  Take 2 tablets by mouth every 6 (six) hours as needed for headache.     NEOMYCIN-POLYMYXIN-HYDROCORTISONE 1 % Soln otic solution  Commonly known as:  CORTISPORIN  Apply 2 drops to toe BID     oxyCODONE 5 MG immediate release tablet  Commonly known as:  Oxy IR/ROXICODONE  Take 1-2 tablets (5-10 mg total) by mouth every 6 (six) hours as needed for moderate pain.     terbinafine 250 MG tablet  Commonly known as:  LAMISIL  Take 1  tablet (250 mg total) by mouth daily.         Follow-up Information   Follow up with [facility] On 04/07/2014. (For post-operation check Your appointment is at 2:00pm, please arrive at least 30 min before your appointment to complete your  check in paperwork.  If you are unable to arrive 30 min prior to your appointment time we may have to cancel or reschedule you.)    Contact information:   [address]   [place] [address] [phone]       Signed: Candiss NorseMegan Dort, PA-C [facility] [phone]  03/14/2014, 9:53 AM

## 2014-03-14 NOTE — Discharge Summary (Signed)
Discharge with follow up instructions

## 2014-03-14 NOTE — Progress Notes (Signed)
Discharge instructions and oxy IR prescription given and explained to pt. And wife.  Both pt. And wife verbalized understanding of all orders/instructions and deny having any questions.  IV removed.  Skin CDI.  Pt. In stable condition for discharge home. Pt. Discharged to home with wife. Vanice Sarahaylor L Thompson

## 2014-03-14 NOTE — Progress Notes (Signed)
Utilization review complete 

## 2014-03-17 ENCOUNTER — Encounter (HOSPITAL_COMMUNITY): Payer: Self-pay | Admitting: General Surgery

## 2014-03-19 ENCOUNTER — Ambulatory Visit (INDEPENDENT_AMBULATORY_CARE_PROVIDER_SITE_OTHER): Payer: Self-pay | Admitting: Podiatry

## 2014-03-19 ENCOUNTER — Encounter: Payer: Self-pay | Admitting: Podiatry

## 2014-03-19 VITALS — BP 112/86 | HR 64 | Resp 16

## 2014-03-19 DIAGNOSIS — B353 Tinea pedis: Secondary | ICD-10-CM

## 2014-03-19 DIAGNOSIS — B351 Tinea unguium: Secondary | ICD-10-CM

## 2014-03-19 DIAGNOSIS — L600 Ingrowing nail: Secondary | ICD-10-CM

## 2014-03-19 NOTE — Progress Notes (Signed)
He presents today for followup of a matrixectomy tibial border hallux bilateral. He states it is been soaking in Betadine and water and apply Cortisporin otic as directed. He also had tinea pedis and onychomycosis for which is been taking Lamisil regularly with no complications.  Objective: Vital signs are stable he is alert and oriented x3. Matrixectomy's appear to be healing quite nicely along the tibial border bilateral. Tinea pedis to the plantar aspect of the bilateral foot appears to be healing as well.  Assessment: Well-healing matrixectomy bilateral hallux. Tinea pedis bilateral foot. And no change in the onychomycosis as of yet.  Plan: Discontinue Betadine start with Epsom salts and water soaks continue the Cortisporin Otic twice daily covered in the day and leave open at night. This was translated for him. Also he will continue his Lamisil for the next 2-3 weeks and I will followup with him at that time and write him a 90 day prescription. No blood work will be necessary.

## 2014-04-07 ENCOUNTER — Encounter (INDEPENDENT_AMBULATORY_CARE_PROVIDER_SITE_OTHER): Payer: Self-pay

## 2014-04-09 ENCOUNTER — Ambulatory Visit: Payer: Self-pay | Admitting: Podiatry

## 2014-04-30 ENCOUNTER — Encounter (INDEPENDENT_AMBULATORY_CARE_PROVIDER_SITE_OTHER): Payer: Medicaid Other

## 2014-05-19 ENCOUNTER — Encounter (INDEPENDENT_AMBULATORY_CARE_PROVIDER_SITE_OTHER): Payer: Self-pay

## 2014-05-22 ENCOUNTER — Encounter (INDEPENDENT_AMBULATORY_CARE_PROVIDER_SITE_OTHER): Payer: Self-pay | Admitting: General Surgery

## 2019-05-13 ENCOUNTER — Encounter (HOSPITAL_COMMUNITY): Payer: Self-pay

## 2019-05-13 ENCOUNTER — Emergency Department (HOSPITAL_COMMUNITY): Payer: Self-pay

## 2019-05-13 ENCOUNTER — Emergency Department (HOSPITAL_COMMUNITY)
Admission: EM | Admit: 2019-05-13 | Discharge: 2019-05-13 | Disposition: A | Discharge status: Home / Self Care | Payer: Self-pay | Attending: Emergency Medicine | Admitting: Emergency Medicine

## 2019-05-13 ENCOUNTER — Other Ambulatory Visit: Payer: Self-pay

## 2019-05-13 DIAGNOSIS — Z79899 Other long term (current) drug therapy: Secondary | ICD-10-CM | POA: Insufficient documentation

## 2019-05-13 DIAGNOSIS — Z20828 Contact with and (suspected) exposure to other viral communicable diseases: Secondary | ICD-10-CM | POA: Insufficient documentation

## 2019-05-13 DIAGNOSIS — J189 Pneumonia, unspecified organism: Secondary | ICD-10-CM

## 2019-05-13 DIAGNOSIS — R030 Elevated blood-pressure reading, without diagnosis of hypertension: Secondary | ICD-10-CM

## 2019-05-13 MED ORDER — BENZONATATE 100 MG PO CAPS: 100.0000 mg | ORAL_CAPSULE | Freq: Three times a day (TID) | ORAL | 0 refills | Status: DC

## 2019-05-13 MED ORDER — AZITHROMYCIN 250 MG PO TABS: ORAL_TABLET | ORAL | 0 refills | Status: AC

## 2019-05-13 NOTE — Discharge Instructions (Signed)
Take the antibiotics as prescribed.  Return for new worsening symptoms.  May take Tylenol as needed for fever.  Follow-up with PCP for reevaluation of your blood pressure.

## 2019-05-13 NOTE — ED Triage Notes (Signed)
Pt reports 2 weeks of dry cough, fever, generalized body aches. Pt alert, resp e.u

## 2019-05-13 NOTE — ED Provider Notes (Signed)
[facility] EMERGENCY DEPARTMENT Provider Note   CSN: [id number] Arrival date & time: 05/13/19  1737  History   Chief Complaint Chief Complaint  Patient presents with  . Fever  . Generalized Body Aches  . Headache   HPI Darrell Turner is a 48 y.o. male with no significant past medical history who presents for evaluation of cough.  Patient states he has had weekly of nonproductive cough.  Patient states he is felt warm at home is however has not taken his temperature.  Patient is also generalized body aches and pains.  Has been taking Tylenol intermittently at home with mild relief of symptoms.  He denies any tobacco use, history of CHF, hemoptysis.  Denies headache, facial asymmetry, dysphasia, unilateral weakness, neck pain, neck stiffness, chest pain, shortness of breath, abdominal pain, diarrhea, dysuria, lower extremity edema, erythema, ecchymosis or warmth.  No history of PE or DVT.  Has had some mild clear rhinorrhea.  Unsure of fluid contacts however states he does work in [employer].  Denies additional aggravating or alleviating factors.  History obtained from patient and past medical records.  Declines medical spanish interpretor.    HPI  History reviewed. No pertinent past medical history.  Patient Active Problem List   Diagnosis Date Noted  . Appendicitis, acute 03/14/2014  . S/P laparoscopic appendectomy 03/13/2014    Past Surgical History:  Procedure Laterality Date  . LAPAROSCOPIC APPENDECTOMY N/A 03/13/2014   Procedure: APPENDECTOMY LAPAROSCOPIC;  Surgeon: Rolm Bookbinder, MD;  Location: [facility];  Service: General;  Laterality: N/A;        Home Medications    Prior to Admission medications   Medication Sig Start Date End Date Taking? Authorizing Provider  aspirin-acetaminophen-caffeine (EXCEDRIN MIGRAINE) [phone] MG per tablet Take 2 tablets by mouth every 6 (six) hours as needed for headache.    [provider]  azithromycin  (ZITHROMAX) 250 MG tablet Take 2 tablets (500 mg total) by mouth daily for 1 day, THEN 1 tablet (250 mg total) daily for 5 days. Take first 2 tablets together, then 1 every day until finished.. 05/13/19 05/19/19  ,  A, PA-C  benzonatate (TESSALON) 100 MG capsule Take 1 capsule (100 mg total) by mouth every 8 (eight) hours. 05/13/19   ,  A, PA-C  NEOMYCIN-POLYMYXIN-HYDROCORTISONE (CORTISPORIN) 1 % SOLN otic solution Apply 2 drops to toe BID 03/05/14   Hyatt, Max T, DPM  oxyCODONE (OXY IR/ROXICODONE) 5 MG immediate release tablet Take 1-2 tablets (5-10 mg total) by mouth every 6 (six) hours as needed for moderate pain. 03/14/14   Nat Christen, PA-C  terbinafine (LAMISIL) 250 MG tablet Take 1 tablet (250 mg total) by mouth daily. 03/05/14   Hyatt, Max T, DPM    Family History No family history on file.  Social History Social History   Tobacco Use  . Smoking status: Never Smoker  Substance Use Topics  . Alcohol use: Yes    Comment: case of beer a week  . Drug use: Not on file     Allergies   Patient has no known allergies.   Review of Systems Review of Systems  Constitutional: Fever: subjective fever.  HENT: Positive for congestion and rhinorrhea. Negative for dental problem, drooling, ear discharge, ear pain, facial swelling, hearing loss, mouth sores, postnasal drip, sinus pressure, sinus pain, sneezing, sore throat, tinnitus, trouble swallowing and voice change.   Respiratory: Positive for cough. Negative for apnea, choking, chest tightness, shortness of breath, wheezing and stridor.  Cardiovascular: Negative.   Gastrointestinal: Negative.   Genitourinary: Negative.   Musculoskeletal: Negative.   Skin: Negative.   Neurological: Negative.   All other systems reviewed and are negative.    Physical Exam Updated Vital Signs BP (!) 152/102 (BP Location: Left Arm)   Pulse 83   Temp 99.4 F (37.4 C) (Oral)   Resp 18   SpO2 99%   Physical Exam Vitals  signs and nursing note reviewed.  Constitutional:      General: He is not in acute distress.    Appearance: He is not ill-appearing, toxic-appearing or diaphoretic.  HENT:     Head: Normocephalic and atraumatic.     Jaw: There is normal jaw occlusion.     Right Ear: Tympanic membrane, ear canal and external ear normal. There is no impacted cerumen. No hemotympanum. Tympanic membrane is not injected, scarred, perforated, erythematous, retracted or bulging.     Left Ear: Tympanic membrane, ear canal and external ear normal. There is no impacted cerumen. No hemotympanum. Tympanic membrane is not injected, scarred, perforated, erythematous, retracted or bulging.     Ears:     Comments: No Mastoid tenderness.    Nose:     Comments: Clear rhinorrhea and congestion to bilateral nares.  No sinus tenderness.    Mouth/Throat:     Comments: Posterior oropharynx clear.  Mucous membranes moist.  Tonsils without erythema or exudate.  Uvula midline without deviation.  No evidence of PTA or RPA.  No drooling, dysphasia or trismus.  Phonation normal. Neck:     Trachea: Trachea and phonation normal.     Meningeal: Brudzinski's sign and Kernig's sign absent.     Comments: No Neck stiffness or neck rigidity.  No meningismus.  No cervical lymphadenopathy. Cardiovascular:     Comments: No murmurs rubs or gallops. Pulmonary:     Comments: Clear to auscultation bilaterally without wheeze, rhonchi or rales.  No accessory muscle usage.  Able speak in full sentences. Abdominal:     Comments: Soft, nontender without rebound or guarding.  No CVA tenderness.  Musculoskeletal:     Comments: Moves all 4 extremities without difficulty.  Lower extremities without edema, erythema or warmth.  Skin:    Comments: Brisk capillary refill.  No rashes or lesions.  Neurological:     Mental Status: He is alert.     Comments: Ambulatory in department without difficulty.  Cranial nerves II through XII grossly intact.  No facial  droop.  No aphasia.    ED Treatments / Results  Labs (all labs ordered are listed, but only abnormal results are displayed) Labs Reviewed  NOVEL CORONAVIRUS, NAA (HOSPITAL ORDER, SEND-OUT TO REF LAB)    EKG None  Radiology Dg Chest Portable 1 View  Result Date: 05/13/2019 CLINICAL DATA:  Two weeks of dry cough, fever and generalized body aches. EXAM: PORTABLE CHEST 1 VIEW COMPARISON:  None. FINDINGS: Cardiomediastinal silhouette is normal. Mediastinal contours appear intact. Diffuse bilateral patchy airspace opacities with peripheral predominance. Osseous structures are without acute abnormality. Soft tissues are grossly normal. IMPRESSION: Diffuse bilateral patchy airspace opacities with peripheral predominance. There are findings in the lungs which are nonspecific, but concerning for atypical infection, including potential viral pneumonia. Electronically Signed   By: Ted Mcalpineobrinka  Dimitrova M.D.   On: 05/13/2019 19:29    Procedures Procedures (including critical care time)  Medications Ordered in ED Medications - No data to display  Initial Impression / Assessment and Plan / ED Course  I have reviewed the triage  vital signs and the nursing notes.  Pertinent labs & imaging results that were available during my care of the patient were reviewed by me and considered in my medical decision making (see chart for details).  48 year old male appears otherwise well presents for evaluation of upper respiratory symptoms.  He is afebrile, nonseptic, non-ill-appearing.  Patient with nasal congestion, rhinorrhea, nonproductive cough as well as body aches and pains x2 weeks.  Heart clear.  Lungs without wheeze, rhonchi or rales.  He has no tachycardia, tachypnea or hypoxia.  No evidence of DVT or PE on exam.  He has no chest pain.  No neck stiffness or neck rigidity.  He is tolerating p.o. intake at home without difficulty.  Posterior oropharynx clear.  Plain film chest x-ray with bilateral lower lobe  infiltrates, question atypical pneumonia versus viral pneumonia.  COVID testing sent.  Given symptoms x2 weeks will DC home with antibiotics to cover for possible atypical pneumonia.  Patient understands COVID testing is pending.  Work note written to be out until resulted.  Given symptomatic management for cough.  Discussed strict return precautions.  Patient does not meet Sirs or sepsis criteria.  Patient also with elevated blood pressure in ED.  Has history of this however does not take any medicines for this.  He is not a PCP.  No HA, chest pain, nausea, vomiting, abdominal pain.  Low suspicion for hypertensive urgency or emergency.  Discussed follow-up with PCP for reevaluation.  The patient has been appropriately medically screened and/or stabilized in the ED. I have low suspicion for any other emergent medical condition which would require further screening, evaluation or treatment in the ED or require inpatient management.  Patient is hemodynamically stable and in no acute distress.  Patient able to ambulate in department prior to ED.  Evaluation does not show acute pathology that would require ongoing or additional emergent interventions while in the emergency department or further inpatient treatment.  I have discussed the diagnosis with the patient and answered all questions.  Patient has no further complaints prior to discharge.  Patient is comfortable with plan discussed in room and is stable for discharge at this time.  I have discussed strict return precautions for returning to the emergency department.  Patient was encouraged to follow-up with PCP/specialist refer to at discharge.     Final Clinical Impressions(s) / ED Diagnoses   Final diagnoses:  Atypical pneumonia  Elevated blood pressure reading    ED Discharge Orders         Ordered    azithromycin (ZITHROMAX) 250 MG tablet     05/13/19 1948    benzonatate (TESSALON) 100 MG capsule  Every 8 hours     05/13/19 1948            ,  A, PA-C 05/13/19 1953    Gerhard MunchLockwood, Robert, MD 05/19/19 1925

## 2019-05-15 LAB — NOVEL CORONAVIRUS, NAA (HOSP ORDER, SEND-OUT TO REF LAB; TAT 18-24 HRS): SARS-CoV-2, NAA: NOT DETECTED

## 2021-09-13 ENCOUNTER — Telehealth: Payer: Self-pay

## 2021-09-13 NOTE — Telephone Encounter (Signed)
NOTES SCANNED TOP REFERRAL

## 2021-10-07 ENCOUNTER — Other Ambulatory Visit (INDEPENDENT_AMBULATORY_CARE_PROVIDER_SITE_OTHER): Payer: 59

## 2021-10-07 ENCOUNTER — Encounter: Payer: Self-pay | Admitting: Nurse Practitioner

## 2021-10-07 ENCOUNTER — Ambulatory Visit (INDEPENDENT_AMBULATORY_CARE_PROVIDER_SITE_OTHER): Payer: 59 | Admitting: Nurse Practitioner

## 2021-10-07 VITALS — BP 122/80 | HR 70 | Ht 69.0 in | Wt 158.1 lb

## 2021-10-07 DIAGNOSIS — K625 Hemorrhage of anus and rectum: Secondary | ICD-10-CM

## 2021-10-07 DIAGNOSIS — R748 Abnormal levels of other serum enzymes: Secondary | ICD-10-CM

## 2021-10-07 DIAGNOSIS — K5900 Constipation, unspecified: Secondary | ICD-10-CM

## 2021-10-07 DIAGNOSIS — K219 Gastro-esophageal reflux disease without esophagitis: Secondary | ICD-10-CM | POA: Diagnosis not present

## 2021-10-07 DIAGNOSIS — R112 Nausea with vomiting, unspecified: Secondary | ICD-10-CM

## 2021-10-07 DIAGNOSIS — R1013 Epigastric pain: Secondary | ICD-10-CM

## 2021-10-07 LAB — BASIC METABOLIC PANEL
BUN: 24 mg/dL — ABNORMAL HIGH (ref 6–23)
CO2: 24 mEq/L (ref 19–32)
Calcium: 9.6 mg/dL (ref 8.4–10.5)
Chloride: 99 mEq/L (ref 96–112)
Creatinine, Ser: 0.82 mg/dL (ref 0.40–1.50)
GFR: 103.74 mL/min (ref >60.00)
Glucose, Bld: 378 mg/dL — ABNORMAL HIGH (ref 70–99)
Potassium: 4 mEq/L (ref 3.5–5.1)
Sodium: 133 mEq/L — ABNORMAL LOW (ref 135–145)

## 2021-10-07 LAB — HEPATIC FUNCTION PANEL
ALT: 16 U/L (ref 0–53)
AST: 12 U/L (ref 0–37)
Albumin: 4.2 g/dL (ref 3.5–5.2)
Alkaline Phosphatase: 106 U/L (ref 39–117)
Bilirubin, Direct: 0.1 mg/dL (ref 0.0–0.3)
Total Bilirubin: 0.5 mg/dL (ref 0.2–1.2)
Total Protein: 7.6 g/dL (ref 6.0–8.3)

## 2021-10-07 LAB — LIPASE: Lipase: 33 U/L (ref 11.0–59.0)

## 2021-10-07 LAB — GAMMA GT: GGT: 17 U/L (ref 7–51)

## 2021-10-07 MED ORDER — PEG 3350-KCL-NA BICARB-NACL 420 G PO SOLR: 4000.0000 mL | Freq: Once | ORAL | 0 refills | Status: AC

## 2021-10-07 NOTE — Progress Notes (Signed)
10/07/2021 Darrell Turner [id number] Feb 24, 1971   CHIEF COMPLAINT: Stomach pain   HISTORY OF PRESENT ILLNESS:  Darrell Turner is a 50 year old male with a past medical history of anxiety, depression, hypertension, diabetes mellitus type 2, hypertriglyceridemia and GERD. S/P laparoscopic appendectomy 2015.  He presents to our office today as referred by Darrell Jewels NP for further evaluation regarding stomach pain, constipation and rectal bleeding.  He speaks Spanish therefore he is accompanied by a Darrell Turner interpreter.  He complains of having nausea and heartburn daily for 6 months.  He has pain below the xiphoid process for 25 years.  He also has epigastric pain for the past 6 months which is worse after eating any food.  He vomited a few x3 months ago without recurrence.  No hematemesis.  He started taking Esomeprazole 40 mg once daily about 3 months ago and his heartburn and epigastric pain have improved. He complains of having constipation, strains to pass a hard bowel movement most days.  His stool diameter varies, may be thin or wide.  He reported seeing a small amount of dark red blood on the stool once monthly for the past year and passes a solid black stool once or twice monthly for the past 2 years.  No Pepto-Bismol or iron use.  Never had an EGD or screening colonoscopy.  No known family history of esophageal, gastric or colon cancer.  Infrequent NSAID use.  Laboratory studies 09/06/2021: WBC 8.1.  Hemoglobin 16.9.  Hematocrit 50.1.  MCV 87.1.  Platelet 268.  Hemoglobin A1c > 14.  Sodium 134.  Potassium 4.4.  Glucose 480.  BUN 14.  Creatinine 0.87.  Alk phos 158.  Total bili 0.70.  AST 8.  ALT 13.  TSH 1.119.  Cholesterol 401.  LDL 47.3.  VLDL 200.  Triglyceride > 1000.   Past Medical History:  Diagnosis Date   Anemia    Anxiety    Bleeding    Depression    Diabetes (HCC)    Excessive urination at night    GERD (gastroesophageal reflux disease)    Headache, chronic daily     High blood pressure    Urination pain     Past Surgical History:  Procedure Laterality Date   LAPAROSCOPIC APPENDECTOMY N/A 03/13/2014   Procedure: APPENDECTOMY LAPAROSCOPIC;  Surgeon: Rolm Bookbinder, MD;  Location: [facility];  Service: General;  Laterality: N/A;   Social History: He is maried.  He has 2 sons and 2 daughters.  He works as a Development worker, international aid.  Non-smoker.  No alcohol use.  No drug use.  Family History: Mother with diabetes. Father is healthy. No family history of esophageal, gastric or colon cancer.   No Known Allergies   Outpatient Encounter Medications as of 10/07/2021  Medication Sig   esomeprazole (NEXIUM) 40 MG capsule Take 40 mg by mouth daily at 12 noon.   lovastatin (MEVACOR) 10 MG tablet Take 10 mg by mouth at bedtime.   metFORMIN (GLUCOPHAGE) 850 MG tablet Take 850 mg by mouth 2 (two) times daily.   polyethylene glycol-electrolytes (NULYTELY) 420 g solution Take 4,000 mLs by mouth once for 1 dose.   [DISCONTINUED] aspirin-acetaminophen-caffeine (EXCEDRIN MIGRAINE) 250-250-65 MG per tablet Take 2 tablets by mouth every 6 (six) hours as needed for headache.   [DISCONTINUED] benzonatate (TESSALON) 100 MG capsule Take 1 capsule (100 mg total) by mouth every 8 (eight) hours.   [DISCONTINUED] NEOMYCIN-POLYMYXIN-HYDROCORTISONE (CORTISPORIN) 1 % SOLN otic solution Apply 2 drops to toe  BID   [DISCONTINUED] oxyCODONE (OXY IR/ROXICODONE) 5 MG immediate release tablet Take 1-2 tablets (5-10 mg total) by mouth every 6 (six) hours as needed for moderate pain.   [DISCONTINUED] terbinafine (LAMISIL) 250 MG tablet Take 1 tablet (250 mg total) by mouth daily.   No facility-administered encounter medications on file as of 10/07/2021.   REVIEW OF SYSTEMS:  Gen: Denies fever, sweats or chills. No weight loss.  CV: Denies chest pain, palpitations or edema. Resp: Denies cough, shortness of breath of hemoptysis.  GI: See HPI.  GU : Denies urinary burning, blood in urine, increased  urinary frequency or incontinence. MS: Denies joint pain, muscles aches or weakness. Derm: Denies rash, itchiness, skin lesions or unhealing ulcers. Psych: Denies depression, anxiety or memory loss.  Heme: Denies bruising, bleeding. Neuro:  Denies headaches, dizziness or paresthesias. Endo:  + DM II.   PHYSICAL EXAM: BP 122/80   Pulse 70   Ht _0  (1.753 m)   Wt 158 lb 2 oz (71.7 kg)   BMI 23.35 kg/m  General: 50 year old male in no acute distress. Head: Normocephalic and atraumatic. Eyes:  Sclerae non-icteric, conjunctive pink. Ears: Normal auditory acuity. Mouth: Dentition intact. No ulcers or lesions.  Neck: Supple, no lymphadenopathy or thyromegaly.  Lungs: Clear bilaterally to auscultation without wheezes, crackles or rhonchi. Heart: Regular rate and rhythm. No murmur, rub or gallop appreciated.  Abdomen: Soft, nontender, non distended. No masses. No hepatosplenomegaly. Normoactive bowel sounds x 4 quadrants.  Rectal: Deferred. Musculoskeletal: Symmetrical with no gross deformities. Skin: Warm and dry. No rash or lesions on visible extremities. Extremities: No edema. Neurological: Alert oriented x 4, no focal deficits.  Psychological:  Alert and cooperative. Normal mood and affect.  ASSESSMENT AND PLAN:  38) 50 year old male with heartburn, epigastric pain. He passes a black solid stool once or twice monthly x 2 years. -Continue Esomeprazole 40 mg daily -GERD handout -EGD benefits and risks discussed including risk with sedation, risk of bleeding, perforation and infection   2) Nausea and vomiting. Daily nausea, infrequent vomiting (last episode occurred 3 months ago). -See plan in Q # 1 -RUQ sonogram to evaluate the gallbladder  -Hepatic panel, lipase  2) Elevated Alk phos with normal T. Bii, AST and ALT levels suggestive of Gilbert's syndrome, however, in the setting of having nausea, infrequent vomiting and chronic lower pain below the xyphoid process to rule out  CBD abnormality/choledocholithiasis.  -RUQ sono as ordered above -Hepatic panel as ordered above and GGT level  4) Constipation  -MiraLAX nightly as needed  5) Rectal bleeding. Never had a screening colonoscopy.  -Colonoscopy benefits and risks discussed including risk with sedation, risk of bleeding, perforation and infection   6) Poorly controlled DM on Metformin. Glucose level 480 on 09/06/2021 -Repeat BMP today to reassess glucose level -Follow-up with PCP      CC:  Karel Jarvis, NP

## 2021-10-07 NOTE — Patient Instructions (Addendum)
PROCEDURES: You have been scheduled for an EGD and Colonoscopy. Please follow the written instructions given to you at your visit today. Please pick up your prep supplies at the pharmacy within the next 1-3 days. If you use inhalers (even only as needed), please bring them with you on the day of your procedure.  LABS:  Lab work has been ordered for you today. Our lab is located in the basement. Press "B" on the elevator. The lab is located at the first door on the left as you exit the elevator.  IMAGING: You will be contacted by [facility] Scheduling (Your caller ID will indicate phone # [phone]) in the next 7 days to schedule your Abdominal ultrasound. If you have not heard from them within 7 business days, please call [facility] Scheduling at [phone] to follow up on the status of your appointment.    Continue Miralax daily. Continue esomeprazole 40 MG once a day. Follow a GERD diet.  It was great seeing you today! Thank you for entrusting me with your care and choosing [facility].  Arnaldo Natal, CRNP  Gastroesophageal Reflux Disease, Adult Gastroesophageal reflux (GER) happens when acid from the stomach flows up into the tube that connects the mouth and the stomach (esophagus). Normally, food travels down the esophagus and stays in the stomach to be digested. With GER, food and stomach acid sometimes move back up into the esophagus. You may have a disease called gastroesophageal reflux disease (GERD) if the reflux: Happens often. Causes frequent or very bad symptoms. Causes problems such as damage to the esophagus. When this happens, the esophagus becomes sore and swollen. Over time, GERD can make small holes (ulcers) in the lining of the esophagus. What are the causes? This condition is caused by a problem with the muscle between the esophagus and the stomach. When this muscle is weak or not normal, it does not close properly to keep  food and acid from coming back up from the stomach. The muscle can be weak because of: Tobacco use. Pregnancy. Having a certain type of hernia (hiatal hernia). Alcohol use. Certain foods and drinks, such as coffee, chocolate, onions, and peppermint. What increases the risk? Being overweight. Having a disease that affects your connective tissue. Taking NSAIDs, such a ibuprofen. What are the signs or symptoms? Heartburn. Difficult or painful swallowing. The feeling of having a lump in the throat. A bitter taste in the mouth. Bad breath. Having a lot of saliva. Having an upset or bloated stomach. Burping. Chest pain. Different conditions can cause chest pain. Make sure you see your doctor if you have chest pain. Shortness of breath or wheezing. A long-term cough or a cough at night. Wearing away of the surface of teeth (tooth enamel). Weight loss. How is this treated? Making changes to your diet. Taking medicine. Having surgery. Treatment will depend on how bad your symptoms are. Follow these instructions at home: Eating and drinking  Follow a diet as told by your doctor. You may need to avoid foods and drinks such as: Coffee and tea, with or without caffeine. Drinks that contain alcohol. Energy drinks and sports drinks. Bubbly (carbonated) drinks or sodas. Chocolate and cocoa. Peppermint and mint flavorings. Garlic and onions. Horseradish. Spicy and acidic foods. These include peppers, chili powder, curry powder, vinegar, hot sauces, and BBQ sauce. Citrus fruit juices and citrus fruits, such as oranges, lemons, and limes. Tomato-based foods. These include red sauce, chili, salsa, and pizza with  red sauce. Fried and fatty foods. These include donuts, french fries, potato chips, and high-fat dressings. High-fat meats. These include hot dogs, rib eye steak, sausage, ham, and bacon. High-fat dairy items, such as whole milk, butter, and cream cheese. Eat small meals often.  Avoid eating large meals. Avoid drinking large amounts of liquid with your meals. Avoid eating meals during the 2-3 hours before bedtime. Avoid lying down right after you eat. Do not exercise right after you eat. Lifestyle  Do not smoke or use any products that contain nicotine or tobacco. If you need help quitting, ask your doctor. Try to lower your stress. If you need help doing this, ask your doctor. If you are overweight, lose an amount of weight that is healthy for you. Ask your doctor about a safe weight loss goal. General instructions Pay attention to any changes in your symptoms. Take over-the-counter and prescription medicines only as told by your doctor. Do not take aspirin, ibuprofen, or other NSAIDs unless your doctor says it is okay. Wear loose clothes. Do not wear anything tight around your waist. Raise (elevate) the head of your bed about 6 inches (15 cm). You may need to use a wedge to do this. Avoid bending over if this makes your symptoms worse. Keep all follow-up visits. Contact a doctor if: You have new symptoms. You lose weight and you do not know why. You have trouble swallowing or it hurts to swallow. You have wheezing or a cough that keeps happening. You have a hoarse voice. Your symptoms do not get better with treatment. Get help right away if: You have sudden pain in your arms, neck, jaw, teeth, or back. You suddenly feel sweaty, dizzy, or light-headed. You have chest pain or shortness of breath. You vomit and the vomit is green, yellow, or black, or it looks like blood or coffee grounds. You faint. Your poop (stool) is red, bloody, or black. You cannot swallow, drink, or eat. These symptoms may represent a serious problem that is an emergency. Do not wait to see if the symptoms will go away. Get medical help right away. Call your local emergency services (911 in the [place] Do not drive yourself to the hospital. Summary If a person has gastroesophageal  reflux disease (GERD), food and stomach acid move back up into the esophagus and cause symptoms or problems such as damage to the esophagus. Treatment will depend on how bad your symptoms are. Follow a diet as told by your doctor. Take all medicines only as told by your doctor. This information is not intended to replace advice given to you by your health care provider. Make sure you discuss any questions you have with your health care provider. Document Revised: 04/26/2020 Document Reviewed: 04/26/2020 Elsevier Patient Education  2022 [employer].

## 2021-10-09 NOTE — Progress Notes (Signed)
Attending Physician's Attestation   I have reviewed the chart.   I agree with the Advanced Practitioner's note, impression, and recommendations with any updates as below. Screening colonoscopy very appropriate.  In the setting of significant persistence and longevity of symptoms diagnostic endoscopy is reasonable as well.   Corliss Parish, MD [facility] Advanced Endoscopy Office # [phone]

## 2021-10-09 NOTE — Progress Notes (Deleted)
Cardiology Office Note   Date:  10/09/2021   ID:  Darrell Turner, DOB [birth year], MRN [id number]  PCP:  Patient, No Pcp Per (Inactive)  Cardiologist:   Borna Wessinger [place], MD   No chief complaint on file.     History of Present Illness: Darrell Turner is a 50 y.o. male who is seen at the request of Charma Igo NP for evaluation of hypertriglyceridemia. He has a history of DM and HLD.     Past Medical History:  Diagnosis Date   Anemia    Anxiety    Bleeding    Depression    Diabetes (HCC)    Excessive urination at night    GERD (gastroesophageal reflux disease)    Headache, chronic daily    High blood pressure    Urination pain     Past Surgical History:  Procedure Laterality Date   LAPAROSCOPIC APPENDECTOMY N/A 03/13/2014   Procedure: APPENDECTOMY LAPAROSCOPIC;  Surgeon: Emelia Loron, MD;  Location: [facility] OR;  Service: General;  Laterality: N/A;     Current Outpatient Medications  Medication Sig Dispense Refill   esomeprazole (NEXIUM) 40 MG capsule Take 40 mg by mouth daily at 12 noon.     lovastatin (MEVACOR) 10 MG tablet Take 10 mg by mouth at bedtime.     metFORMIN (GLUCOPHAGE) 850 MG tablet Take 850 mg by mouth 2 (two) times daily.     No current facility-administered medications for this visit.    Allergies:   Patient has no known allergies.    Social History:  The patient  reports that he has never smoked. He does not have any smokeless tobacco history on file. He reports that he does not currently use alcohol. He reports that he does not use drugs.   Family History:  The patient's ***family history includes Diabetes in his mother.    ROS:  Please see the history of present illness.   Otherwise, review of systems are positive for {NONE DEFAULTED:18576}.   All other systems are reviewed and negative.    PHYSICAL EXAM: VS:  There were no vitals taken for this visit. , BMI There is no height or weight on file to calculate BMI. GEN: Well nourished, well  developed, in no acute distress HEENT: normal Neck: no JVD, carotid bruits, or masses Cardiac: ***RRR; no murmurs, rubs, or gallops,no edema  Respiratory:  clear to auscultation bilaterally, normal work of breathing GI: soft, nontender, nondistended, + BS MS: no deformity or atrophy Skin: warm and dry, no rash Neuro:  Strength and sensation are intact Psych: euthymic mood, full affect   EKG:  EKG {ACTION; IS/IS RFF:63846659} ordered today. The ekg ordered today demonstrates ***   Recent Labs: 10/07/2021: ALT 16; BUN 24; Creatinine, Ser 0.82; Potassium 4.0; Sodium 133    Lipid Panel No results found for: CHOL, TRIG, HDL, CHOLHDL, VLDL, LDLCALC, LDLDIRECT    Wt Readings from Last 3 Encounters:  10/07/21 158 lb 2 oz (71.7 kg)  03/13/14 175 lb 14.8 oz (79.8 kg)  03/05/14 180 lb (81.6 kg)      Other studies Reviewed: Additional studies/ records that were reviewed today include: ***. Review of the above records demonstrates: ***   ASSESSMENT AND PLAN:  1.  ***   Current medicines are reviewed at length with the patient today.  The patient {ACTIONS; HAS/DOES NOT HAVE:19233} concerns regarding medicines.  The following changes have been made:  {PLAN; NO CHANGE:13088:s}  Labs/ tests ordered today include: *** No orders of the  defined types were placed in this encounter.        Disposition:   FU with *** in {gen number 5-07:225750} {Days to years:10300}  Signed, Makinzy Cleere [place], MD  10/09/2021 7:34 AM    [facility] [address], [place], [place], [address] Phone [phone], Fax [phone]

## 2021-10-11 ENCOUNTER — Ambulatory Visit: Payer: Self-pay | Admitting: Cardiology

## 2021-10-20 ENCOUNTER — Other Ambulatory Visit: Payer: Self-pay

## 2021-10-20 ENCOUNTER — Ambulatory Visit (HOSPITAL_COMMUNITY)
Admission: RE | Admit: 2021-10-20 | Discharge: 2021-10-20 | Disposition: A | Discharge status: Home / Self Care | Payer: 59 | Source: Ambulatory Visit | Attending: Nurse Practitioner | Admitting: Nurse Practitioner

## 2021-10-20 ENCOUNTER — Ambulatory Visit (HOSPITAL_COMMUNITY): Admission: RE | Admit: 2021-10-20 | Payer: 59 | Source: Ambulatory Visit

## 2021-10-20 DIAGNOSIS — K625 Hemorrhage of anus and rectum: Secondary | ICD-10-CM | POA: Diagnosis present

## 2021-10-20 DIAGNOSIS — K219 Gastro-esophageal reflux disease without esophagitis: Secondary | ICD-10-CM | POA: Insufficient documentation

## 2021-10-20 DIAGNOSIS — K5900 Constipation, unspecified: Secondary | ICD-10-CM | POA: Diagnosis present

## 2021-10-20 DIAGNOSIS — R112 Nausea with vomiting, unspecified: Secondary | ICD-10-CM | POA: Insufficient documentation

## 2021-10-20 DIAGNOSIS — R748 Abnormal levels of other serum enzymes: Secondary | ICD-10-CM | POA: Diagnosis present

## 2021-10-25 ENCOUNTER — Telehealth: Payer: Self-pay | Admitting: Nurse Practitioner

## 2021-10-25 NOTE — Telephone Encounter (Signed)
Called patient to advise him of recent lab results and also to obtain the name of his current PCP to provide them with lab results as well. The patient is aware of results and voice understanding however he was not aware of the doctors name. Patient said he will find out from his sister and call the office back with the information.

## 2021-10-25 NOTE — Telephone Encounter (Signed)
Thank you for your help.

## 2021-10-26 NOTE — Telephone Encounter (Signed)
Patient of [facility] Faxed the B[facility] to the office at [phone]

## 2021-11-30 ENCOUNTER — Telehealth: Payer: Self-pay | Admitting: Nurse Practitioner

## 2021-11-30 NOTE — Telephone Encounter (Signed)
Pt is requesting a call back to see if the prep instructions could be provided to him in Spanish, or if he could come by and pick them up.

## 2021-12-01 NOTE — Telephone Encounter (Signed)
Instructions printed in Spanish and placed at front desk for pick up. Could you please call and let him know? Thank you!

## 2021-12-05 NOTE — Telephone Encounter (Signed)
Left vm advising of letter and to pick it up at the office front desk.

## 2021-12-06 ENCOUNTER — Telehealth: Payer: Self-pay | Admitting: Gastroenterology

## 2021-12-06 MED ORDER — PEG 3350-KCL-NA BICARB-NACL 420 G PO SOLR: 4000.0000 mL | Freq: Once | ORAL | 0 refills | Status: AC

## 2021-12-06 NOTE — Telephone Encounter (Signed)
RX sent to pharmacy. Please let him know. Thank you!

## 2021-12-06 NOTE — Telephone Encounter (Addendum)
Patients nephew called and stated that pharmacy does not have prep prescription.  Please advise. His procedure is scheduled for tomorrow.

## 2021-12-07 ENCOUNTER — Ambulatory Visit: Payer: 59 | Admitting: Gastroenterology

## 2021-12-07 ENCOUNTER — Encounter: Payer: Self-pay | Admitting: Gastroenterology

## 2021-12-07 ENCOUNTER — Other Ambulatory Visit: Payer: Self-pay

## 2021-12-07 ENCOUNTER — Telehealth: Payer: Self-pay

## 2021-12-07 VITALS — BP 146/81 | HR 69 | Temp 98.2°F | Ht 69.0 in | Wt 158.0 lb

## 2021-12-07 DIAGNOSIS — R112 Nausea with vomiting, unspecified: Secondary | ICD-10-CM

## 2021-12-07 DIAGNOSIS — K625 Hemorrhage of anus and rectum: Secondary | ICD-10-CM

## 2021-12-07 MED ORDER — SODIUM CHLORIDE 0.9 % IV SOLN: 500.0000 mL | Freq: Once | INTRAVENOUS | Status: AC

## 2021-12-07 NOTE — Progress Notes (Signed)
Patient has evidence of recent/progressive exertional chest pain that has been occurring as discussed without our Anesthesia team. He has not had or been evaluated by his PCP for this at this time and has not seen Cardiology in the past. At this point, we cannot pursue a screening colonoscopy and EGD in the setting of this at this time. As such he will need to be postponed. He needs to discuss this further with his PCP and be evaluated by them as soon as able. In the setting of his underlying diabetes this certainly needs for [place] to keep in mind that patients can develop underlying coronary atherosclerosis. If he cannot follow up with them about this, he should be evaluated by UC or the ED, especially if this is progressive. He is currently asymptomatic and does not have any blood pressure issues and no issues on EKG leads to suggest need for inpatient evaluation currently. We will postpone his procedures. He will have recall in the system set up for 2-3 months to have his procedures rescheduled. We will need PCP or Cardiology optimization/clearance before his procedures are performed. Unfortunate that he is having these symptoms even after being prepped but from a safety perspective it is necessary to postpone currently.   Justice Britain, MD [facility] Gastro[facility] Advanced Endoscopy Office # [phone]

## 2021-12-07 NOTE — Telephone Encounter (Signed)
The recall has been entered and note from today has been sent to PCP and Michail Jewels NP.

## 2021-12-07 NOTE — Telephone Encounter (Signed)
-----   Message from Lemar Lofty., MD sent at 12/07/2021  8:32 AM EST ----- Regarding: Follow-up [name], Please forward my note from today to the patient's PCP and the referring provider. The patient over the course of the last 3 to 5 weeks has developed exertional chest pain of an unclear etiology.  It needs further work-up before he can undergo anesthesia in our endoscopy center.  As his procedure was a screening colonoscopy and his EGD was for further work-up of abdominal pain and heartburn that had been improved on PPI therapy, we are going to postpone that for now. Please put a recall for 2 to 3 months from now for repeat EGD/colonoscopy scheduling. He will need PCP or cardiology optimization/clearance before his next procedures are scheduled. Thank you. GM  CKS, FYI with the change in the patient's status in the last few weeks.

## 2021-12-07 NOTE — Progress Notes (Signed)
Pt has been experiencing chest pain with heavy activity per patient.  This has had numerous times and he has been told he need to see his pcp. Vss.  Denies any chest pain at this time.

## 2021-12-07 NOTE — Progress Notes (Signed)
Pt's states no medical or surgical changes since previsit or office visit. 

## 2021-12-13 NOTE — Progress Notes (Signed)
Cardiology Office Note:    Date:  12/15/2021   ID:  Darrell Turner, DOB 09/07/1971, MRN [id number]  PCP:  [facility]   [facility] Providers Cardiologist:  Alverda Skeans, MD Referring MD: Willaim Rayas, NP   Chief Complaint/Reason for Referral: Hypertriglyceridemia and preprocedural evaluation for colonoscopy and EGD.  ASSESSMENT:    Angina pectoris (HCC)  Mixed hypercholesterolemia and hypertriglyceridemia  Preop cardiovascular exam  Type 2 diabetes mellitus without complication, without long-term current use of insulin (HCC)  Hypertension, unspecified type    PLAN:    In order of problems listed above:  1.  The patient had an episode of angina yesterday and has had multiple episodes with exertion.  He is on no medical therapy whatsoever.  We will start a trial of medical therapy.  We will start aspirin 81 mg, Toprol-XL 12.5 mg at bedtime, Imdur 30 mg at bedtime, and as needed nitroglycerin.  We will contact the patient early next week.  We will call his [name] at [phone].  If he is not doing better on medical therapy then we will refer him for cardiac catheterization due to high pretest probability of coronary artery disease.  Otherwise I will see him back in 3 months or earlier if needed.  2.  We will stop lovastatin and start atorvastatin 40 mg and check a lipid panel and LFTs in 6 weeks.  Depending on these results he may need to see pharmacy for further recommendations.  3.  His screening colonoscopy will be on hold until more data is obtained regarding his course regarding #1 above.  4.  We will start aspirin 81 mg, atorvastatin as detailed above, losartan 12.5 mg daily, and Jardiance 10 mg daily due to type 2 diabetes.  5.  His blood pressure is right at goal in terms of his diastolic blood pressure.  We will start losartan due to renal preservation effects for diabetics.            Dispo:  No follow-ups on file.      Medication Adjustments/Labs and Tests Ordered: Current medicines are reviewed at length with the patient today.  Concerns regarding medicines are outlined above.   Tests Ordered: No orders of the defined types were placed in this encounter.   Medication Changes: No orders of the defined types were placed in this encounter.   History of Present Illness:    FOCUSED CARDIOVASCULAR PROBLEM LIST:   1.  Type 2 diabetes with hemoglobin A1c of greater than 14 2.  Hypertension 3.  Hyperlipidemia with hypertriglyceridemia  The patient is a 51 y.o. male with the indicated medical history here for recommendations regarding hypertriglyceridemia and a preprocedural assessment prior to colonoscopy and EGD.  Patient was seen by his primary care provider recently and labs were drawn which demonstrated a triglyceride level greater than 1000.  Of note hemoglobin A1c was greater than 14.  Additionally the patient was referred for screening colonoscopy and EGD and this was canceled due to a history of exertional angina.       Previous Medical History: Past Medical History:  Diagnosis Date   Anemia    Anxiety    Bleeding    Depression    Diabetes (HCC)    Excessive urination at night    GERD (gastroesophageal reflux disease)    Headache, chronic daily    High blood pressure    Urination pain      Current Medications: Current Meds  Medication Sig  esomeprazole (NEXIUM) 40 MG capsule Take 40 mg by mouth daily at 12 noon.   lovastatin (MEVACOR) 10 MG tablet Take 10 mg by mouth at bedtime.   metFORMIN (GLUCOPHAGE) 850 MG tablet Take 850 mg by mouth 2 (two) times daily.   Current Facility-Administered Medications for the 12/15/21 encounter (Office Visit) with [name], MD  Medication   0.9 %  sodium chloride infusion     Allergies:    Patient has no known allergies.   Social History:   Social History   Tobacco Use   Smoking status: Never  Vaping Use   Vaping Use: Never used   Substance Use Topics   Alcohol use: Not Currently    Comment: case of beer a week   Drug use: Never     Family Hx: Family History  Problem Relation Age of Onset   Diabetes Mother      Review of Systems:   Please see the history of present illness.    All other systems reviewed and are negative.     EKGs/Labs/Other Test Reviewed:    EKG: Sinus rhythm with T wave inversion in III  Prior CV studies: None available  Imaging studies that I have independently reviewed today: Right upper quadrant ultrasound December 2022 demonstrating likely hepatic steatosis  Recent Labs: 10/07/2021: ALT 16; BUN 24; Creatinine, Ser 0.82; Potassium 4.0; Sodium 133   Recent Lipid Panel No results found for: CHOL, TRIG, HDL, LDLCALC, LDLDIRECT  Risk Assessment/Calculations:          Physical Exam:    VS:  BP 122/80 (BP Location: Left Arm, Patient Position: Sitting, Cuff Size: Normal)    Pulse (!) 56    Ht 5\' 9"  (1.753 m)    Wt 157 lb (71.2 kg)    SpO2 94%    BMI 23.18 kg/m    Wt Readings from Last 3 Encounters:  12/15/21 157 lb (71.2 kg)  12/07/21 158 lb (71.7 kg)  10/07/21 158 lb 2 oz (71.7 kg)    GENERAL:  No apparent distress, AOx3 HEENT:  No carotid bruits, +2 carotid impulses, no scleral icterus CAR: RRR no murmurs, gallops, rubs, or thrills RES:  Clear to auscultation bilaterally ABD:  Soft, nontender, nondistended, positive bowel sounds x 4 VASC:  +2 radial pulses, +2 carotid pulses, palpable pedal pulses NEURO:  CN 2-12 grossly intact; motor and sensory grossly intact PSYCH:  No active depression or anxiety EXT:  No edema, ecchymosis, or cyanosis  Signed, 14/09/22, MD  12/15/2021 3:27 PM    [facility] [address] [place], [place], [place]  [place] Phone: [phone]; Fax: [phone]   Note:  This document was prepared using Dragon voice recognition software and may include unintentional dictation errors.

## 2021-12-15 ENCOUNTER — Other Ambulatory Visit: Payer: Self-pay

## 2021-12-15 ENCOUNTER — Encounter: Payer: Self-pay | Admitting: Internal Medicine

## 2021-12-15 ENCOUNTER — Ambulatory Visit: Payer: 59 | Admitting: Internal Medicine

## 2021-12-15 VITALS — BP 122/80 | HR 56 | Ht 69.0 in | Wt 157.0 lb

## 2021-12-15 DIAGNOSIS — E119 Type 2 diabetes mellitus without complications: Secondary | ICD-10-CM | POA: Diagnosis not present

## 2021-12-15 DIAGNOSIS — E782 Mixed hyperlipidemia: Secondary | ICD-10-CM

## 2021-12-15 DIAGNOSIS — Z01818 Encounter for other preprocedural examination: Secondary | ICD-10-CM | POA: Diagnosis not present

## 2021-12-15 DIAGNOSIS — I209 Angina pectoris, unspecified: Secondary | ICD-10-CM | POA: Diagnosis not present

## 2021-12-15 DIAGNOSIS — I10 Essential (primary) hypertension: Secondary | ICD-10-CM

## 2021-12-15 DIAGNOSIS — Z01810 Encounter for preprocedural cardiovascular examination: Secondary | ICD-10-CM

## 2021-12-15 MED ORDER — ASPIRIN EC 81 MG PO TBEC: 81.0000 mg | DELAYED_RELEASE_TABLET | Freq: Every day | ORAL | 3 refills | Status: AC

## 2021-12-15 MED ORDER — ISOSORBIDE MONONITRATE ER 30 MG PO TB24: 30.0000 mg | ORAL_TABLET | Freq: Every day | ORAL | 3 refills | Status: DC

## 2021-12-15 MED ORDER — METOPROLOL SUCCINATE ER 25 MG PO TB24: 12.5000 mg | ORAL_TABLET | Freq: Every day | ORAL | 3 refills | Status: DC

## 2021-12-15 MED ORDER — NITROGLYCERIN 0.4 MG SL SUBL: 0.4000 mg | SUBLINGUAL_TABLET | SUBLINGUAL | 3 refills | Status: AC | PRN

## 2021-12-15 MED ORDER — LOSARTAN POTASSIUM 25 MG PO TABS: 12.5000 mg | ORAL_TABLET | Freq: Every day | ORAL | 3 refills | Status: DC

## 2021-12-15 MED ORDER — EMPAGLIFLOZIN 10 MG PO TABS: 10.0000 mg | ORAL_TABLET | Freq: Every day | ORAL | 5 refills | Status: DC

## 2021-12-15 MED ORDER — ATORVASTATIN CALCIUM 40 MG PO TABS: 40.0000 mg | ORAL_TABLET | Freq: Every day | ORAL | 3 refills | Status: DC

## 2021-12-15 NOTE — Patient Instructions (Signed)
Medication Instructions:  Your physician has recommended you make the following change in your medication:  1.) start aspirin 81 mg daily 2.) start metoprolol succinate (Toprol XL) 25 mg --take HALF TABLET daily at bedtime 3.) start isosorbide (IMDUR) 30 mg - one tablet daily at bedtime 4.) atorvastatin 40 mg - one tablet daily 5.) Jardiance 10 mg - one tablet daily 6.) losartan 25 mg - ONE HALF TABLET - daly 7). Stop lovastatin 8.) start nitroglycerin -   You are prescribed NITROGLYCERIN for chest pain. Make sure to follow instructions to take this medication. A single nitroglycerin sublingual tablet should be placed under your tongue and allowed to dissolve. Do not chew or swallow the tablet. You may take an additional tablet every 5 minutes, but no more than 3 tablets in 15 minutes. If your chest pain does not resolve after taking 3 tablets, seek immediate medical attention.  *If you need a refill on your cardiac medications before your next appointment, please call your pharmacy*   Lab Work: Please return in about 6-8 weeks for blood work (Lipids and liver function)   Testing/Procedures: NONE   Follow-Up: At [facility], you and your health needs are our priority.  As part of our continuing mission to provide you with exceptional heart care, we have created designated Provider Care Teams.  These Care Teams include your primary Cardiologist (physician) and Advanced Practice Providers (APPs -  Physician Assistants and Nurse Practitioners) who all work together to provide you with the care you need, when you need it.  We recommend signing up for the patient portal called "MyChart".  Sign up information is provided on this After Visit Summary.  MyChart is used to connect with patients for Virtual Visits (Telemedicine).  Patients are able to view lab/test results, encounter notes, upcoming appointments, etc.  Non-urgent messages can be sent to your provider as well.   To learn more  about what you can do with MyChart, go to [email or web address].    Your next appointment:   3 month(s)  The format for your next appointment:   In Person  Provider:   [name], MD     Other Instructions  We will call your nephew next week (Tuesday) to see how your symptoms are after starting the medications.

## 2021-12-20 ENCOUNTER — Telehealth: Payer: Self-pay | Admitting: *Deleted

## 2021-12-20 NOTE — Telephone Encounter (Signed)
-----   Message from [name], MD sent at 12/20/2021  2:53 PM EST ----- Regarding: Chest pain Please let me know about his chest pain symptoms, thanks.

## 2021-12-20 NOTE — Telephone Encounter (Signed)
Spoke with the patient's nephew Manfred Arch.  He said the patient said his chest is feeling better and he is doing fine taking his medications.  We discussed that if he is having any further chest pain episodes that Dr. Lynnette Caffey recommends a heart catheterization.  Mr. Sondra Come is going to be speaking with the patient tonight and will confirm with him that he has not had any further chest pains.

## 2022-02-01 ENCOUNTER — Other Ambulatory Visit: Payer: 59 | Admitting: *Deleted

## 2022-02-01 DIAGNOSIS — I10 Essential (primary) hypertension: Secondary | ICD-10-CM

## 2022-02-01 DIAGNOSIS — I209 Angina pectoris, unspecified: Secondary | ICD-10-CM

## 2022-02-01 DIAGNOSIS — E119 Type 2 diabetes mellitus without complications: Secondary | ICD-10-CM

## 2022-02-01 DIAGNOSIS — E782 Mixed hyperlipidemia: Secondary | ICD-10-CM

## 2022-02-01 DIAGNOSIS — Z01810 Encounter for preprocedural cardiovascular examination: Secondary | ICD-10-CM

## 2022-02-01 LAB — HEPATIC FUNCTION PANEL
ALT: 22 IU/L (ref 0–44)
AST: 15 IU/L (ref 0–40)
Albumin: 4.5 g/dL (ref 4.0–5.0)
Alkaline Phosphatase: 141 IU/L — ABNORMAL HIGH (ref 44–121)
Bilirubin Total: 0.4 mg/dL (ref 0.0–1.2)
Bilirubin, Direct: 0.11 mg/dL (ref 0.00–0.40)
Total Protein: 7.5 g/dL (ref 6.0–8.5)

## 2022-02-01 LAB — LIPID PANEL
Chol/HDL Ratio: 7.9 ratio — ABNORMAL HIGH (ref 0.0–5.0)
Cholesterol, Total: 292 mg/dL — ABNORMAL HIGH (ref 100–199)
HDL: 37 mg/dL — ABNORMAL LOW (ref >39)
Triglycerides: 1085 mg/dL (ref 0–149)

## 2022-02-08 ENCOUNTER — Telehealth: Payer: Self-pay | Admitting: *Deleted

## 2022-02-08 DIAGNOSIS — E782 Mixed hyperlipidemia: Secondary | ICD-10-CM

## 2022-02-08 NOTE — Telephone Encounter (Signed)
Referral placed to PharmD for lipids/triglyceride management. ?Left message for [facility] ([facility]) to call back. ?

## 2022-02-08 NOTE — Telephone Encounter (Signed)
-----   Message from Early Osmond, MD sent at 02/02/2022  7:06 AM EDT ----- ?Lipids and TG not under control with statin.  Please have patient see pharmacy for management. ?

## 2022-02-08 NOTE — Telephone Encounter (Signed)
Patient's nephew returned call.  Arranged PharmD visit for lipids/trigs for 03/03/22 9:30 am.  Will need interpretor. Mr. Sondra Come voices understanding and will communicate this with the patient.  Referral placed. ?

## 2022-03-03 ENCOUNTER — Encounter: Payer: Self-pay | Admitting: Pharmacist

## 2022-03-03 ENCOUNTER — Ambulatory Visit: Payer: 59 | Admitting: Pharmacist

## 2022-03-03 ENCOUNTER — Telehealth: Payer: Self-pay | Admitting: Pharmacist

## 2022-03-03 DIAGNOSIS — E782 Mixed hyperlipidemia: Secondary | ICD-10-CM

## 2022-03-03 DIAGNOSIS — E119 Type 2 diabetes mellitus without complications: Secondary | ICD-10-CM

## 2022-03-03 MED ORDER — METFORMIN HCL 500 MG PO TABS: 500.0000 mg | ORAL_TABLET | Freq: Two times a day (BID) | ORAL | 3 refills | Status: AC

## 2022-03-03 MED ORDER — OZEMPIC (0.25 OR 0.5 MG/DOSE) 2 MG/3ML ~~LOC~~ SOPN: 0.2500 mg | PEN_INJECTOR | SUBCUTANEOUS | 1 refills | Status: AC

## 2022-03-03 MED ORDER — FENOFIBRATE 145 MG PO TABS: 145.0000 mg | ORAL_TABLET | Freq: Every day | ORAL | 3 refills | Status: AC

## 2022-03-03 NOTE — Telephone Encounter (Signed)
Ozempic approved through 03/04/23.  ?I called and spoke with [facility] per pt request. Advised that if cost was >$25 to have patient get copay card. ?We reviewed what we discussed in clinic and reviewed some dietary changes.  ?Darrell Turner was very appreciative of the help. ?

## 2022-03-03 NOTE — Patient Instructions (Addendum)
Deje de tomar lovastatina ?Contin?e tomando atorvastatina 40 mg al d?a, succinato de metoprolol 12,5 mg al d?a, Jardiance 10 mg al d?a, aspirina 81 mg al d?a, isosrobide 30 mg al d?a. ?Comience a tomar metfromina 500 mg dos veces al d?a con alimentos ?Comience a tomar fenofibrato 145 mg al d?a. ? ?Deja de beber refrescos ? ?Ver? si su seguro cubre Ozempic, el medicamento que le ense?? a Tourist information centre manager. ?Llamar? a Miguel cuando me entere. ? ?Por favor ll?meme al [phone] con cualquier pregunta ? ?Venga a hacerse un an?lisis de sangre en ayunas el lunes 7/10. Puedes venir al laboratorio a Glass blower/designer de las 7:15 AM ? ? ?Stop taking lovastatin ?Continue taking atorvastatin 40mg  daily, metoprolol succinate 12.5mg  daily, Jardiance 10mg  daily, Aspirin 81mg  daily, isosrobide 30mg  daily. ?Start taking metfromin 500mg  twice a day with food ?Start taking fenofibrate 145mg  daily ? ?Stop drinking soda ? ?I will see if your insurance will cover Ozempic, the medication I showed you how to inject ?I will call [facility] when I find out ? ?Please call me at [phone] with any questions ? ?Please come for fasting blood work Monday 7/10. You can come to the lab starting at 7:15 AM ?

## 2022-03-03 NOTE — Progress Notes (Signed)
Patient ID: Darrell Turner                 DOB: 06-18-1971                    MRN: [id number] ? ? ? ? ?HPI: ?Darrell Turner is a 51 y.o. male patient referred to lipid clinic by Dr. Lynnette Caffey. PMH is significant for DM, HTN, HLD. At visit with Dr. Lynnette Caffey on 2/16 patients lovastatin was stopped and atorvastatin 40mg  daily was started. Repeat labs showed TG >1000. His glucose was 500 on a lab the next week when he was seen in ED for hemoptysis and constipation. ? ?Patient presents today to lipid clinic. He is accompanied by a . Patient brings in his medications bottles. He is taking both fluvastatin and atorvastatin. States he did not take some of his medications for about a week but is back taking all of them. He does have a PCP but has not seen them since his ER visit. He had some chest pain yesterday for the first time since starting metoprolol and isosorbide.  ? ?He drinks soda and eats fast food for breakfast. Does eat rice, tortilla, vegetables and fruit. No alcohol. He requests we call Nurse, learning disability his nephew if we need to call him.  ? ?The only thing he is taking for his DM is Jardiance. No A1C available. ? ?Current Medications: atorvastatin 40mg  daily ?Intolerances:  ?Risk Factors: DM, HTN ?LDL goal: <55 ?TG goal: <150 ? ?Diet: soda ?Breakfast: sausage egg biscuit, tacos ?Lunch: eats out- steak ?Dinner: rice, chicken soup ?Snack: apple, peach ? ?Exercise: works for [name] ? ?Family History:  ?Family History  ?Problem Relation Age of Onset  ? Diabetes Mother   ? ? ?Social History: no tobacco, no alcohol ? ?Labs: 02/01/22 TC 292, TG 1085, HDL 37 (atorvastatin 40mg  daily) ? ?Past Medical History:  ?Diagnosis Date  ? Anemia   ? Anxiety   ? Bleeding   ? Depression   ? Diabetes (HCC)   ? Excessive urination at night   ? GERD (gastroesophageal reflux disease)   ? Headache, chronic daily   ? High blood pressure   ? Urination pain   ? ? ?Current Outpatient Medications on File Prior to Visit  ?Medication Sig  Dispense Refill  ? aspirin EC 81 MG tablet Take 1 tablet (81 mg total) by mouth daily. Swallow whole. 90 tablet 3  ? atorvastatin (LIPITOR) 40 MG tablet Take 1 tablet (40 mg total) by mouth daily. 90 tablet 3  ? empagliflozin (JARDIANCE) 10 MG TABS tablet Take 1 tablet (10 mg total) by mouth daily before breakfast. 30 tablet 5  ? esomeprazole (NEXIUM) 40 MG capsule Take 40 mg by mouth daily at 12 noon.    ? isosorbide mononitrate (IMDUR) 30 MG 24 hr tablet Take 1 tablet (30 mg total) by mouth daily. 90 tablet 3  ? losartan (COZAAR) 25 MG tablet Take 0.5 tablets (12.5 mg total) by mouth daily. 45 tablet 3  ? metFORMIN (GLUCOPHAGE) 850 MG tablet Take 850 mg by mouth 2 (two) times daily.    ? metoprolol succinate (TOPROL XL) 25 MG 24 hr tablet Take 0.5 tablets (12.5 mg total) by mouth at bedtime. 45 tablet 3  ? nitroGLYCERIN (NITROSTAT) 0.4 MG SL tablet Place 1 tablet (0.4 mg total) under the tongue every 5 (five) minutes as needed for chest pain. 25 tablet 3  ? ?Current Facility-Administered Medications on File Prior to Visit  ?Medication Dose Route Frequency  Provider Last Rate Last Admin  ? 0.9 %  sodium chloride infusion  500 mL Intravenous Once Mansouraty, Netty Starring., MD      ? ? ?No Known Allergies ? ?Assessment/Plan: ? ?1. Hyperlipidemia - TG are extremely elevated most likely due to uncontrolled DM. Patient's medlist had metformin on it, but patient states he is not taking and doesn't remember being on it. He states he does have a PCP. I advised that I would get him started on some medications to help with his blood sugar control but he needed to follow up with his PCP. Will start metformin 500mg  BID. I will also look into the coverage of Ozempic for the patient. I reviewed the injection technique and dosing with him. He is ok with a cost of $25/month (copay card).  ?Because his TG are >1000, I am concerned about his pancreatitis risk. Will start fenofibrate 145mg  daily. Most important is to work on blood sugar  control. Patient will stop drinking soda. Recommended he not get breakfast from fast food, instead make eggs at home. ?Repeat labs 7/10.  ? ? ?Thank you, ? ? ? , Pharm.D, BCPS, CPP ?[facility]  ?[address], [place], [address] [place]  ?Phone: [phone]; Fax: [phone]  ? ? ?

## 2022-03-21 NOTE — Progress Notes (Signed)
Cardiology Office Note:    Date:  03/24/2022   ID:  Darrell Turner, DOB 09/07/1971, MRN [id number]  PCP:  Darrell Turner, [place] Prov[facility] Cardiologist:  Darrell Sciara, MD Referring MD: [facility]*   Chief Complaint/Reason for Referral: Follow-up angina  ASSESSMENT:    Angina pectoris ([place])  Type 2 diabetes mellitus without complication, without long-term current use of insulin ([place])  Hypertension associated with diabetes ([place])  Hyperlipidemia associated with type 2 diabetes mellitus ([place])    PLAN:    In order of problems listed above: 1.  Angina pectoris: This seems to be well controlled on Toprol and Imdur.  We will continue current management.  We will refer for coronary CTA and echocardiogram for risk stratification however he may undergo EGD and colonoscopy as this is a low risk procedures.  Follow up in 6 months. 2.  Type 2 diabetes: The patient is on metformin and Ozempic per pharmacy.  Continue aspirin, losartan, Jardiance, and atorvastatin for now.  Will re-refer to pharmacy for management.  He has not yet taken Ozempic. 3.  Hypertension: Continue losartan and Toprol with goal blood pressure of less than 130/80. 4.  Hyperlipidemia patient has associated hypertriglyceridemia as well.  This is being managed by the pharmacy division.  We appreciate pharmacy recommendations and management.  We will re-refer back to pharmacy for care continuity.   Dispo:  Return in about 6 months (around 09/24/2022).     Medication Adjustments/Labs and Tests Ordered: Current medicines are reviewed at length with the patient today.  Concerns regarding medicines are outlined above.   Tests Ordered: No orders of the defined types were placed in this encounter.   Medication Changes: No orders of the defined types were placed in this encounter.   History of Present Illness:    FOCUSED CARDIOVASCULAR PROBLEM LIST:   1.  Type 2 diabetes with hemoglobin  A1c of greater than 14 2.  Hypertension 3.  Hyperlipidemia with hypertriglyceridemia 4.  Spanish-speaking  February 2023: The patient was seen for initial consultation regarding angina pectoris.  He is scheduled for a screening colonoscopy and EGD which was canceled due to angina.  At that visit he was started on losartan 12.5 mg daily, aspirin 81 mg daily, Jardiance 10 mg daily, atorvastatin 40 mg daily, Toprol-XL 12.5 mg at bedtime, and Imdur at bedtime as well as as needed nitroglycerin.  If his chest pain did not respond to medical therapy cardiac catheterization would be pursued given his high pretest probability of coronary artery disease given his multiple cardiovascular risk factors.  In the interim our office contact the patient and he reported that his chest pain had resolved with medical therapy.  He was referred to pharmacy due to very abnormal lipid panel with hypertriglyceridemia.  He was started on metformin 500 mg twice daily as well as fenofibrate.  Ozempic was also approved.  He has repeat labs to be drawn in July per pharmacy.  Today: The patient is doing very well.  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  He has not required emergency room visits or hospitalizations.  He does not smoke.  He is completely compliant with his medications.  He has not yet started Ozempic per pharmacy.  He denies any bleeding or bruising episodes, signs or symptoms of stroke, orthopnea, or paroxysmal nocturnal dyspnea.    Current Medications: Current Meds  Medication Sig   aspirin EC 81 MG tablet Take 1 tablet (  81 mg total) by mouth daily. Swallow whole.   atorvastatin (LIPITOR) 40 MG tablet Take 1 tablet (40 mg total) by mouth daily.   empagliflozin (JARDIANCE) 10 MG TABS tablet Take 1 tablet (10 mg total) by mouth daily before breakfast.   esomeprazole (NEXIUM) 40 MG capsule Take 40 mg by mouth daily at 12 noon.   fenofibrate (TRICOR) 145 MG  tablet Take 1 tablet (145 mg total) by mouth daily.   isosorbide mononitrate (IMDUR) 30 MG 24 hr tablet Take 1 tablet (30 mg total) by mouth daily.   losartan (COZAAR) 25 MG tablet Take 0.5 tablets (12.5 mg total) by mouth daily.   metFORMIN (GLUCOPHAGE) 500 MG tablet Take 1 tablet (500 mg total) by mouth 2 (two) times daily with a meal.   metoprolol succinate (TOPROL XL) 25 MG 24 hr tablet Take 0.5 tablets (12.5 mg total) by mouth at bedtime.   nitroGLYCERIN (NITROSTAT) 0.4 MG SL tablet Place 1 tablet (0.4 mg total) under the tongue every 5 (five) minutes as needed for chest pain.   Semaglutide,0.25 or 0.5MG /DOS, (OZEMPIC, 0.25 OR 0.5 MG/DOSE,) 2 MG/3ML SOPN Inject 0.25 mg into the skin once a week.   Current Facility-Administered Medications for the 03/24/22 encounter (Office Visit) with Early Osmond, MD  Medication   0.9 %  sodium chloride infusion     Allergies:    Patient has no known allergies.   Social History:   Social History   Tobacco Use   Smoking status: Never  Vaping Use   Vaping Use: Never used  Substance Use Topics   Alcohol use: Not Currently    Comment: case of beer a week   Drug use: Never     Family Hx: Family History  Problem Relation Age of Onset   Diabetes Mother      Review of Systems:   Please see the history of present illness.    All other systems reviewed and are negative.     EKGs/Labs/Other Test Reviewed:    EKG: Sinus rhythm with T wave inversion in III  Prior CV studies: None available  Imaging studies that I have independently reviewed today: Right upper quadrant ultrasound December 2022 demonstrating likely hepatic steatosis  Recent Labs: 10/07/2021: BUN 24; Creatinine, Ser 0.82; Potassium 4.0; Sodium 133 02/01/2022: ALT 22   Recent Lipid Panel Lab Results  Component Value Date/Time   CHOL 292 (H) 02/01/2022 07:27 AM   TRIG 1,085 (HH) 02/01/2022 07:27 AM   HDL 37 (L) 02/01/2022 07:27 AM   LDLCALC Comment (A) 02/01/2022 07:27  AM    Risk Assessment/Calculations:          Physical Exam:    VS:  BP 118/78   Pulse 67   Ht 5\' 6"  (1.676 m)   Wt 158 lb (71.7 kg)   SpO2 96%   BMI 25.50 kg/m    Wt Readings from Last 3 Encounters:  03/24/22 158 lb (71.7 kg)  12/15/21 157 lb (71.2 kg)  12/07/21 158 lb (71.7 kg)    GENERAL:  No apparent distress, AOx3 HEENT:  No carotid bruits, +2 carotid impulses, no scleral icterus CAR: RRR no murmurs, gallops, rubs, or thrills RES:  Clear to auscultation bilaterally ABD:  Soft, nontender, nondistended, positive bowel sounds x 4 VASC:  +2 radial pulses, +2 carotid pulses, palpable pedal pulses NEURO:  CN 2-12 grossly intact; motor and sensory grossly intact PSYCH:  No active depression or anxiety EXT:  No edema, ecchymosis, or cyanosis  Signed, Clarene Duke  Luiz Iron, MD  03/24/2022 3:29 PM    [facility] [place], [place], [place]  [address] Phone: [phone]; Fax: [phone]   Note:  This document was prepared using Dragon voice recognition software and may include unintentional dictation errors.

## 2022-03-24 ENCOUNTER — Ambulatory Visit: Payer: 59 | Admitting: Internal Medicine

## 2022-03-24 ENCOUNTER — Encounter: Payer: Self-pay | Admitting: Internal Medicine

## 2022-03-24 VITALS — BP 118/78 | HR 67 | Ht 66.0 in | Wt 158.0 lb

## 2022-03-24 DIAGNOSIS — I209 Angina pectoris, unspecified: Secondary | ICD-10-CM

## 2022-03-24 DIAGNOSIS — E119 Type 2 diabetes mellitus without complications: Secondary | ICD-10-CM

## 2022-03-24 DIAGNOSIS — I152 Hypertension secondary to endocrine disorders: Secondary | ICD-10-CM

## 2022-03-24 DIAGNOSIS — E785 Hyperlipidemia, unspecified: Secondary | ICD-10-CM

## 2022-03-24 DIAGNOSIS — E1159 Type 2 diabetes mellitus with other circulatory complications: Secondary | ICD-10-CM

## 2022-03-24 DIAGNOSIS — Z01812 Encounter for preprocedural laboratory examination: Secondary | ICD-10-CM

## 2022-03-24 DIAGNOSIS — E1169 Type 2 diabetes mellitus with other specified complication: Secondary | ICD-10-CM

## 2022-03-24 NOTE — Patient Instructions (Addendum)
Medication Instructions:  No changes *If you need a refill on your cardiac medications before your next appointment, please call your pharmacy*   Lab Work:  If you have labs (blood work) drawn today and your tests are completely normal, you will receive your results only by: Darrell Turner (if you have MyChart) OR A paper copy in the mail If you have any lab test that is abnormal or we need to change your treatment, we will call you to review the results.   Testing/Procedures: Your physician has requested that you have an echocardiogram. Echocardiography is a painless test that uses sound waves to create images of your heart. It provides your doctor with information about the size and shape of your heart and how well your heart's chambers and valves are working. This procedure takes approximately one hour. There are no restrictions for this procedure.  Cardiac CTA - see instructions below.   Follow-Up: At [facility], you and your health needs are our priority.  As part of our continuing mission to provide you with exceptional heart care, we have created designated Provider Care Teams.  These Care Teams include your primary Cardiologist (physician) and Advanced Practice Providers (APPs -  Physician Assistants and Nurse Practitioners) who all work together to provide you with the care you need, when you need it.  We recommend signing up for the patient portal called "MyChart".  Sign up information is provided on this After Visit Summary.  MyChart is used to connect with patients for Virtual Visits (Telemedicine).  Patients are able to view lab/test results, encounter notes, upcoming appointments, etc.  Non-urgent messages can be sent to your provider as well.   To learn more about what you can do with MyChart, go to [email or web address].    Your next appointment:   6 month(s)  The format for your next appointment:   In Person  Provider:   Early Osmond, MD    Please also  schedule follow up with PharmD.    Other Instructions   Your cardiac CT will be scheduled at  [facility] [place], [place] [address] [phone]  Please arrive at the [facility] Entrance (Entrance C2) of [facility] 30 minutes prior to test start time. You can use the FREE valet parking offered at entrance C (encouraged to control the heart rate for the test)  Proceed to the [facility] Radiology Department (first floor) to check-in and test prep.  All radiology patients and guests should use entrance C2 at [facility], accessed from [facility], even though the hospital's physical address listed is [address]     Please follow these instructions carefully (unless otherwise directed):  Hold all erectile dysfunction medications at least 3 days (72 hrs) prior to test.  On the Night Before the Test: Be sure to Drink plenty of water. Do not consume any caffeinated/decaffeinated beverages or chocolate 12 hours prior to your test. Do not take any antihistamines 12 hours prior to your test.  On the Day of the Test: Drink plenty of water until 1 hour prior to the test. Do not eat any food 4 hours prior to the test. You may take your regular medications prior to the test.  Take metoprolol two hours prior to test.  After the Test: Drink plenty of water. After receiving IV contrast, you may experience a mild flushed feeling. This is normal. On occasion, you may experience a mild rash up  to 24 hours after the test. This is not dangerous. If this occurs, you can take Benadryl 25 mg and increase your fluid intake. If you experience trouble breathing, this can be serious. If it is severe call 911 IMMEDIATELY. If it is mild, please call our office. If you take any of these medications: Glipizide/Metformin, Avandament, Glucavance, please do not take 48 hours after completing test unless otherwise  instructed.  We will call to schedule your test 2-4 weeks out understanding that some insurance companies will need an authorization prior to the service being performed.   For non-scheduling related questions, please contact the cardiac imaging nurse navigator should you have any questions/concerns: Marchia Bond, Cardiac Imaging Nurse Navigator Gordy Clement, Cardiac Imaging Nurse Navigator [facility] Direct Office Dial: [phone]   For scheduling needs, including cancellations and rescheduling, please call [place], [phone].   Important Information About Sugar

## 2022-04-14 ENCOUNTER — Telehealth (HOSPITAL_COMMUNITY): Payer: Self-pay | Admitting: *Deleted

## 2022-04-14 NOTE — Telephone Encounter (Signed)
Attempted to call patient's preferred contact regarding upcoming cardiac CT appointment and to remind him about obtain labs. Left message on voicemail with name and callback number  Larey Brick RN Navigator Cardiac Imaging [facility] [phone] Office [phone] Cell

## 2022-04-17 ENCOUNTER — Ambulatory Visit (HOSPITAL_COMMUNITY)
Admission: RE | Admit: 2022-04-17 | Discharge: 2022-04-17 | Disposition: A | Discharge status: Home / Self Care | Payer: 59 | Source: Ambulatory Visit | Attending: Internal Medicine | Admitting: Internal Medicine

## 2022-04-17 DIAGNOSIS — I209 Angina pectoris, unspecified: Secondary | ICD-10-CM

## 2022-04-17 IMAGING — CT CT HEART MORP W/ CTA COR W/ SCORE W/ CA W/CM &/OR W/O CM
4 of 7 series · 8 of 20 positions shown, 9 images · IV contrast (APPLIED)
Comparison: None Available.
COMPARISON: None Available.

Addendum:
EXAM:
OVER-READ INTERPRETATION  CT CHEST

The following report is an over-read performed by radiologist Dr.
VILLETTE [REDACTED] on [DATE]. This
over-read does not include interpretation of cardiac or coronary
anatomy or pathology. The coronary CTA interpretation by the
cardiologist is attached.
HISTORY: 51 yo male with angina pectoris, risk stratification
Cardiac/Coronary CTA
TECHNIQUE: The patient was scanned on a Siemens Force scanner.
PROTOCOL: A 120 kV prospective scan was triggered in the descending thoracic
aorta at 111 HU's. Axial non-contrast 3 mm slices were carried out
through the heart. The data set was analyzed on a dedicated work
station and scored using the Agatson method. Gantry rotation speed
was 250 msecs and collimation was .6 mm. Beta blockade and 0.8 mg of
sl NTG was given. The 3D data set was reconstructed in 5% intervals
of the 35-75 % of the R-R cycle. Diastolic phases were analyzed on a
dedicated work station using MPR, MIP and VRT modes. The patient
received 98mL OMNIPAQUE IOHEXOL 350 MG/ML SOLN of contrast.

[Series 6: best diast 76 % · axial · 0.39mm/px · z∈[-159,-127]mm · 2 of 239 slices shown]
[im 80/239  vessel]
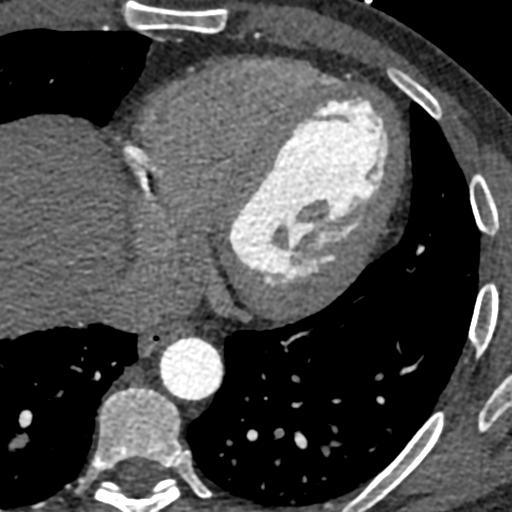
[im 159/239  vessel]
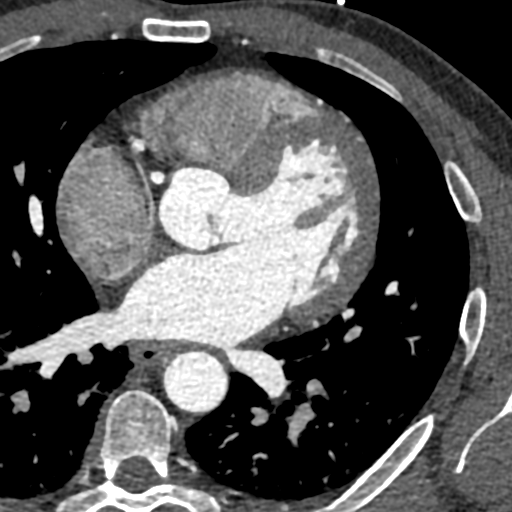

[Series 7: best syst · axial · 0.39mm/px · z∈[-159,-127]mm · 2 of 239 slices shown, 3 images]
[im 80/239  vessel]
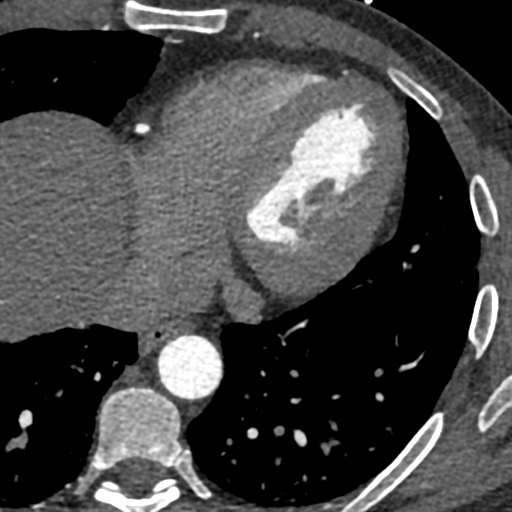
[im 80/239  lung]
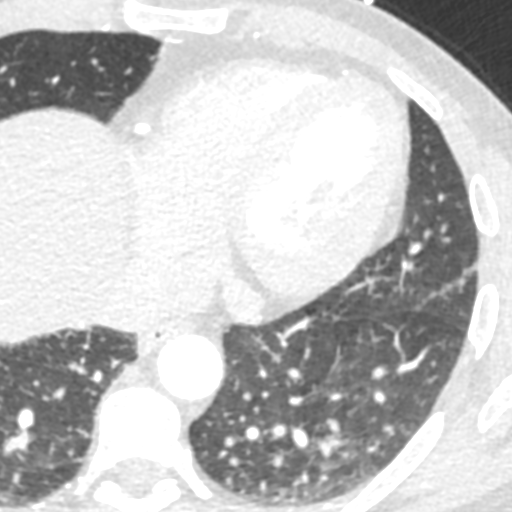
[im 159/239  vessel]
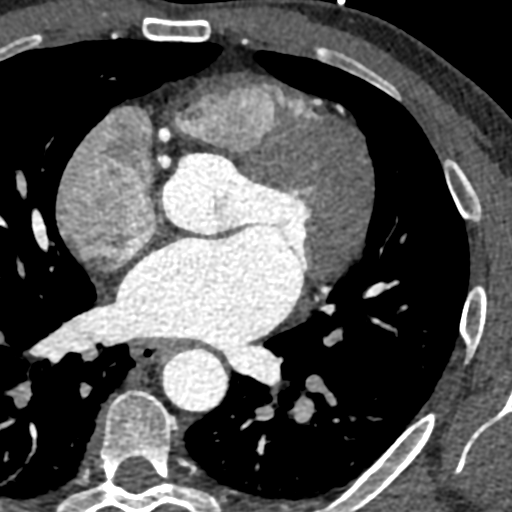

[Series 8: ts diast sharp 76 % · axial · 0.39mm/px · z∈[-159,-127]mm · 2 of 239 slices shown]
[im 80/239  lung]
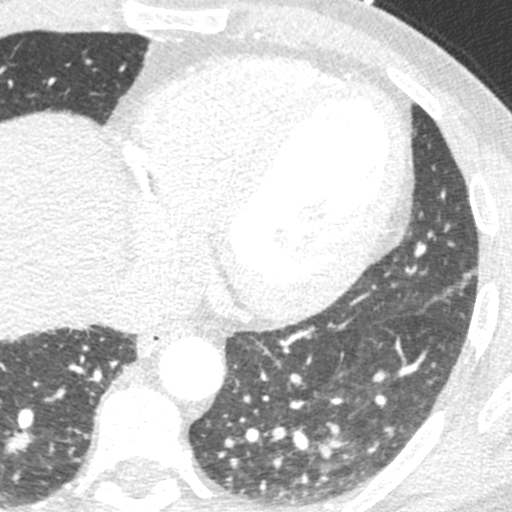
[im 159/239  lung]
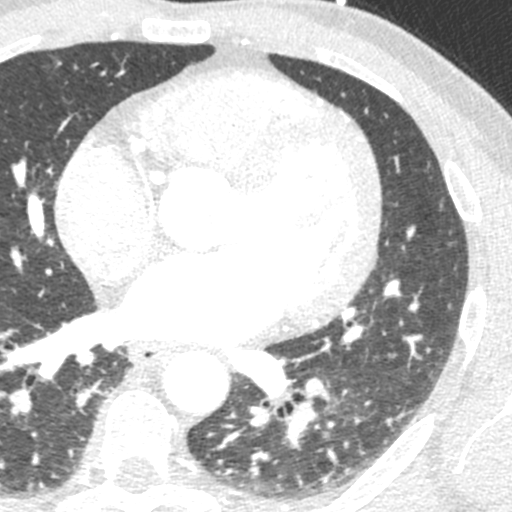

[Series 9: ts syst sharp · axial · 0.39mm/px · z∈[-159,-127]mm · 2 of 239 slices shown]
[im 80/239  lung]
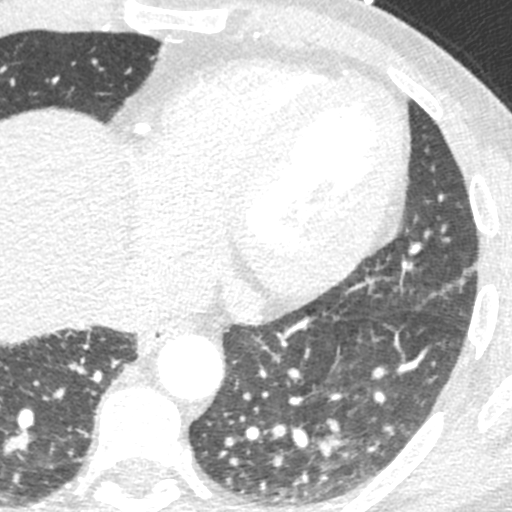
[im 159/239  lung]
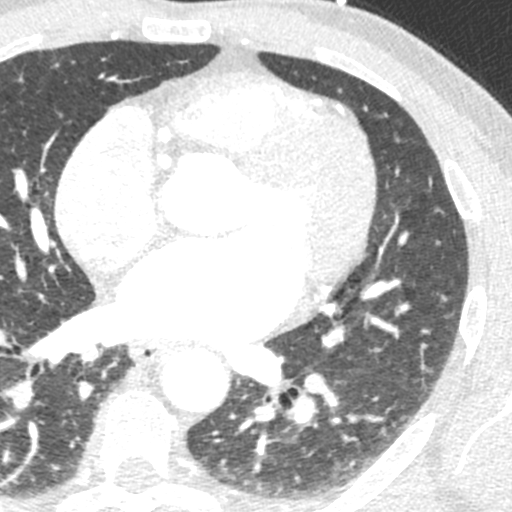

[8 of 20 positions shown; findings below may reference images not displayed]

FINDINGS: Vascular: No significant noncardiac vascular findings.

Mediastinum/Nodes: Calcified mediastinal and bilateral hilar lymph
nodes are consistent with prior granulomatous disease.

Lungs/Pleura: Calcified granuloma of the left upper lobe. Visualized
lungs show no evidence of pulmonary edema, consolidation,
pneumothorax or pleural fluid.

Upper Abdomen: No acute abnormality.

Musculoskeletal: No chest wall mass or suspicious bone lesions
identified.
IMPRESSION: Evidence of prior granulomatous disease.
FINDINGS: Quality: Good, HR 80, misalignment artifact

Coronary calcium score: The patient's coronary artery calcium score
is 0, which places the patient in the 0 percentile.

Coronary arteries: Normal coronary origins.  Right dominance.

Right Coronary Artery: Dominant.  Normal vessel.

Left Main Coronary Artery: Normal. Bifurcates into the LAD and LCx
arteries.

Left Anterior Descending Coronary Artery: Large anterior artery with
3 diagonal branches. No disease.

Left Circumflex Artery: AV groove vessel, no disease.

Aorta: Normal size, 28 mm at the mid ascending aorta (level of the
PA bifurcation) measured double oblique. No calcifications. No
dissection.

Aortic Valve: Trileaflet. No calcifications.

Other findings:

Normal pulmonary vein drainage into the left atrium.

Normal left atrial appendage without a thrombus.

Normal size of the pulmonary artery.
IMPRESSION: 1. No evidence of CAD, CADRADS = 0.

2. Coronary calcium score of 0. This was 0 percentile for age and
sex matched control.

3. Normal coronary origin with right dominance.

4. Consider non-coronary causes of chest pain

*** End of Addendum ***
EXAM:
OVER-READ INTERPRETATION  CT CHEST

The following report is an over-read performed by radiologist Dr.
VILLETTE [REDACTED] on [DATE]. This
over-read does not include interpretation of cardiac or coronary
anatomy or pathology. The coronary CTA interpretation by the
cardiologist is attached.
FINDINGS: Vascular: No significant noncardiac vascular findings.

Mediastinum/Nodes: Calcified mediastinal and bilateral hilar lymph
nodes are consistent with prior granulomatous disease.

Lungs/Pleura: Calcified granuloma of the left upper lobe. Visualized
lungs show no evidence of pulmonary edema, consolidation,
pneumothorax or pleural fluid.

Upper Abdomen: No acute abnormality.

Musculoskeletal: No chest wall mass or suspicious bone lesions
identified.
IMPRESSION: Evidence of prior granulomatous disease.

## 2022-04-17 MED ORDER — IOHEXOL 350 MG/ML SOLN
98.0000 mL | Freq: Once | INTRAVENOUS | Status: AC | PRN
  Administered 2022-04-17: 98 mL via INTRAVENOUS

## 2022-04-17 MED ORDER — NITROGLYCERIN 0.4 MG SL SUBL
SUBLINGUAL_TABLET | SUBLINGUAL | Status: AC
  Filled 2022-04-17: qty 2

## 2022-04-17 MED ORDER — NITROGLYCERIN 0.4 MG SL SUBL
0.8000 mg | SUBLINGUAL_TABLET | Freq: Once | SUBLINGUAL | Status: AC
  Administered 2022-04-17: 0.8 mg via SUBLINGUAL

## 2022-04-18 ENCOUNTER — Other Ambulatory Visit (HOSPITAL_COMMUNITY): Payer: 59

## 2022-04-20 ENCOUNTER — Telehealth: Payer: Self-pay | Admitting: Pharmacist

## 2022-04-20 ENCOUNTER — Encounter (HOSPITAL_COMMUNITY): Payer: Self-pay | Admitting: Cardiology

## 2022-04-20 ENCOUNTER — Other Ambulatory Visit (HOSPITAL_COMMUNITY): Payer: 59

## 2022-04-20 ENCOUNTER — Ambulatory Visit: Payer: 59

## 2022-04-20 NOTE — Progress Notes (Unsigned)
Patient ID: Darrell Turner                 DOB: February 15, 1971                    MRN: [id number]     HPI: Darrell Turner is a 51 y.o. male patient referred to lipid clinic by Dr. Lynnette Caffey. PMH is significant for DM, HTN, HLD. At visit with Dr. Lynnette Caffey on 2/16 patients lovastatin was stopped and atorvastatin 40mg  daily was started. Repeat labs showed TG >1000. His glucose was 500 on a lab the next week when he was seen in ED for hemoptysis and constipation. At last visit with PharmD on 03/03/22, pt reported taking both fluvastatin and atorvastatin. Fluvastatin was stopped at that time. Pt had uncontrolled diabetes, which was felt to be contributing to hypertriglyceridemia. Metformin 500mg  BID and Ozempic 0.25 mg weekly was started. Patient also taking Jardiance 10 mg. Fenofibrate 145 mg daily was added given risk for pancreatitis with TG >1000. Since that visit, pt presented to the ED on 02/10/22 with hemoptysis and constipation. Pt was last seen by Dr. on 03/24/22 where CT showed CAC score of 0. and was referred again to pharmacy for hypertriglyceridemia.  Check A1c and lipid panels. increase statin if well tolerated. Can submit PA for Vascepa. Can increase ozempic if BG uncontrolled  Friday health plan - vascepa T2 Ask pt about PCP A1c on 7/10  Current Medications: atorvastatin 40mg  daily, Tricor 145 mg daily Intolerances: none noted Risk Factors: DM, HTN LDL goal: <55 TG goal: <150  Diet: soda Breakfast: sausage egg biscuit, tacos Lunch: eats out- steak Dinner: rice, chicken soup Snack: apple, peach  Exercise: works for Tuesday  Family History:  Family History  Problem Relation Age of Onset   Diabetes Mother     Social History: no tobacco, no alcohol  Labs: 02/01/22 TC 292, TG 1085, HDL 37 (atorvastatin 40mg  daily)  Past Medical History:  Diagnosis Date   Anemia    Anxiety    Bleeding    Depression    Diabetes (HCC)    Excessive urination at night    GERD  (gastroesophageal reflux disease)    Headache, chronic daily    High blood pressure    Urination pain     Current Outpatient Medications on File Prior to Visit  Medication Sig Dispense Refill   aspirin EC 81 MG tablet Take 1 tablet (81 mg total) by mouth daily. Swallow whole. 90 tablet 3   atorvastatin (LIPITOR) 40 MG tablet Take 1 tablet (40 mg total) by mouth daily. 90 tablet 3   empagliflozin (JARDIANCE) 10 MG TABS tablet Take 1 tablet (10 mg total) by mouth daily before breakfast. 30 tablet 5   esomeprazole (NEXIUM) 40 MG capsule Take 40 mg by mouth daily at 12 noon.     fenofibrate (TRICOR) 145 MG tablet Take 1 tablet (145 mg total) by mouth daily. 90 tablet 3   isosorbide mononitrate (IMDUR) 30 MG 24 hr tablet Take 1 tablet (30 mg total) by mouth daily. 90 tablet 3   losartan (COZAAR) 25 MG tablet Take 0.5 tablets (12.5 mg total) by mouth daily. 45 tablet 3   metFORMIN (GLUCOPHAGE) 500 MG tablet Take 1 tablet (500 mg total) by mouth 2 (two) times daily with a meal. 180 tablet 3   metoprolol succinate (TOPROL XL) 25 MG 24 hr tablet Take 0.5 tablets (12.5 mg total) by mouth at bedtime. 45 tablet 3  nitroGLYCERIN (NITROSTAT) 0.4 MG SL tablet Place 1 tablet (0.4 mg total) under the tongue every 5 (five) minutes as needed for chest pain. 25 tablet 3   Semaglutide,0.25 or 0.5MG /DOS, (OZEMPIC, 0.25 OR 0.5 MG/DOSE,) 2 MG/3ML SOPN Inject 0.25 mg into the skin once a week. 3 mL 1   Current Facility-Administered Medications on File Prior to Visit  Medication Dose Route Frequency Provider Last Rate Last Admin   0.9 %  sodium chloride infusion  500 mL Intravenous Once Mansouraty, Netty Starring., MD        No Known Allergies  Assessment/Plan:  1. Hyperlipidemia -

## 2022-04-20 NOTE — Progress Notes (Signed)
Patient marked "no show" for echocardiogram at 13:21.

## 2022-04-20 NOTE — Telephone Encounter (Signed)
Attempted to contact pt using telephone interpretor, however, the call went to voicemail. The interpreter left a message asking to call back to reschedule his appointment. A PA for Vascepa was sent and approved.

## 2022-05-08 ENCOUNTER — Other Ambulatory Visit: Payer: 59

## 2022-05-16 NOTE — Telephone Encounter (Signed)
Pt missed apt for labs. Called Nephew per DPR and left VM for him to call back to reschedule.

## 2022-05-18 ENCOUNTER — Encounter: Payer: Self-pay | Admitting: *Deleted

## 2022-07-26 ENCOUNTER — Telehealth: Payer: Self-pay

## 2022-07-26 NOTE — Telephone Encounter (Signed)
**Note De-Identified Darrell Turner Obfuscation** [facility] Key: [id number] - PA Case ID: [id number] - Rx #: [id number] Outcome: Approved today Prior Auth;Coverage Start Date:07/26/2022;Coverage End Date:07/26/2023; Drug Jardiance 10MG  tablets Form [employer] Electronic PA Form [phone] NCPDP) Original Claim Info MR  I have notified [id number] - Lady [place], [place] (Ph: [phone]) of this approval and I did request that they fill and to then notify the pt when ready for pick up.

## 2022-10-01 NOTE — Progress Notes (Unsigned)
Cardiology Office Note:    Date:  10/02/2022   ID:  Darrell Turner, DOB 06-02-1971, MRN [id number]  PCP:  [facility], [place] Providers Cardiologist:  Lenna Sciara, MD Referring MD: [facility]*   Chief Complaint/Reason for Referral: Follow-up angina  ASSESSMENT:    Precordial pain  Type 2 diabetes mellitus without complication, without long-term current use of insulin ([place]) - Plan: LDL cholesterol, direct, Lipoprotein A (LPA), Hepatic function panel, Lipid panel, Hemoglobin A1c  Hypertension associated with diabetes ([place]) - Plan: LDL cholesterol, direct, Lipoprotein A (LPA), Hepatic function panel, Lipid panel, Hemoglobin A1c  Hyperlipidemia associated with type 2 diabetes mellitus ([place]) - Plan: LDL cholesterol, direct, Lipoprotein A (LPA), Hepatic function panel, Lipid panel, Hemoglobin A1c    PLAN:    In order of problems listed above: 1.  Chest pain: Coronary CTA was reassuring.  We will stop Imdur and Toprol-XL as well as as needed nitroglycerin. 2.  Type 2 diabetes: The patient is on metformin and Ozempic per pharmacy.  Continue aspirin, losartan, Jardiance, and atorvastatin for now.  Check hemoglobin A1c today. 3.  Hypertension: Continue losartan with goal blood pressure of less than 130/80.  Given that we are stopping Imdur and Toprol increase losartan to 25 mg.. 4.  Hyperlipidemia: Check direct LDL, LFTs, and LP(a) today; depending on results will refer back to pharmacy.   Dispo:  Return in about 9 months (around 07/04/2023).     Medication Adjustments/Labs and Tests Ordered: Current medicines are reviewed at length with the patient today.  Concerns regarding medicines are outlined above.   Tests Ordered: Orders Placed This Encounter  Procedures   LDL cholesterol, direct   Lipoprotein A (LPA)   Hepatic function panel   Lipid panel   Hemoglobin A1c    Medication Changes: Meds ordered this encounter  Medications   losartan  (COZAAR) 25 MG tablet    Sig: Take 1 tablet (25 mg total) by mouth daily.    Dispense:  45 tablet    Refill:  3    History of Present Illness:    FOCUSED CARDIOVASCULAR PROBLEM LIST:   1.  Type 2 diabetes with hemoglobin A1c of greater than 14 2.  Hypertension 3.  Hyperlipidemia with hypertriglyceridemia 4.  Spanish-speaking 5.  Coronary CTA June 2023 with calcium score of 0 and no obstructive disease  February 2023: The patient was seen for initial consultation regarding angina pectoris.  He is scheduled for a screening colonoscopy and EGD which was canceled due to angina.  At that visit he was started on losartan 12.5 mg daily, aspirin 81 mg daily, Jardiance 10 mg daily, atorvastatin 40 mg daily, Toprol-XL 12.5 mg at bedtime, and Imdur at bedtime as well as as needed nitroglycerin.  If his chest pain did not respond to medical therapy cardiac catheterization would be pursued given his high pretest probability of coronary artery disease given his multiple cardiovascular risk factors.  In the interim our office contact the patient and he reported that his chest pain had resolved with medical therapy.  He was referred to pharmacy due to very abnormal lipid panel with hypertriglyceridemia.  He was started on metformin 500 mg twice daily as well as fenofibrate.  Ozempic was also approved.  He has repeat labs to be drawn in July per pharmacy.  Plan: Start aspirin 81 mg, Toprol-XL 12.5 mg, and Imdur 30 mg for medical treatment of presumed chronic stable angina; change lovastatin to atorvastatin; start losartan 12.5 mg  a day and Jardiance 10 mg a day due to type 2 diabetes.  May 2023: The patient is doing very well.  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  He has not required emergency room visits or hospitalizations.  He does not smoke.  He is completely compliant with his medications.  He has not yet started Ozempic per pharmacy.  He denies any  bleeding or bruising episodes, signs or symptoms of stroke, orthopnea, or paroxysmal nocturnal dyspnea.  Plan: Refer for coronary CTA and echocardiogram to exclude multivessel disease, left main disease, and LV dysfunction.  Today: The patient's coronary CTA demonstrated no obstructive coronary artery disease with a calcium score of 0.  An echocardiogram was not performed.  He is doing well.  He denies any chest pain, shortness of breath, presyncope, syncope, palpitations, paroxysmal nocturnal dyspnea, orthopnea.  He is working with his primary care physician and says that his diabetes is under better control.    Current Medications: Current Meds  Medication Sig   aspirin EC 81 MG tablet Take 1 tablet (81 mg total) by mouth daily. Swallow whole.   atorvastatin (LIPITOR) 40 MG tablet Take 1 tablet (40 mg total) by mouth daily.   empagliflozin (JARDIANCE) 10 MG TABS tablet Take 1 tablet (10 mg total) by mouth daily before breakfast.   esomeprazole (NEXIUM) 40 MG capsule Take 40 mg by mouth daily at 12 noon.   fenofibrate (TRICOR) 145 MG tablet Take 1 tablet (145 mg total) by mouth daily.   losartan (COZAAR) 25 MG tablet Take 1 tablet (25 mg total) by mouth daily.   metFORMIN (GLUCOPHAGE) 500 MG tablet Take 1 tablet (500 mg total) by mouth 2 (two) times daily with a meal.   nitroGLYCERIN (NITROSTAT) 0.4 MG SL tablet Place 1 tablet (0.4 mg total) under the tongue every 5 (five) minutes as needed for chest pain.   Semaglutide,0.25 or 0.5MG /DOS, (OZEMPIC, 0.25 OR 0.5 MG/DOSE,) 2 MG/3ML SOPN Inject 0.25 mg into the skin once a week.   [DISCONTINUED] isosorbide mononitrate (IMDUR) 30 MG 24 hr tablet Take 1 tablet (30 mg total) by mouth daily.   [DISCONTINUED] losartan (COZAAR) 25 MG tablet Take 0.5 tablets (12.5 mg total) by mouth daily.   [DISCONTINUED] metoprolol succinate (TOPROL XL) 25 MG 24 hr tablet Take 0.5 tablets (12.5 mg total) by mouth at bedtime.   Current Facility-Administered Medications  for the 10/02/22 encounter (Office Visit) with Early Osmond, MD  Medication   0.9 %  sodium chloride infusion     Allergies:    Patient has no known allergies.   Social History:   Social History   Tobacco Use   Smoking status: Never  Vaping Use   Vaping Use: Never used  Substance Use Topics   Alcohol use: Not Currently    Comment: case of beer a week   Drug use: Never     Family Hx: Family History  Problem Relation Age of Onset   Diabetes Mother      Review of Systems:   Please see the history of present illness.    All other systems reviewed and are negative.     EKGs/Labs/Other Test Reviewed:    EKG: Sinus rhythm with T wave inversion in III  Prior CV studies: Coronary CTA 2023 with calcium score of 0 the artery disease  Imaging studies that I have independently reviewed today: Right upper quadrant ultrasound December 2022 demonstrating likely hepatic steatosis; no imaging evidence available aortic  atherosclerosis  Recent Labs: 10/07/2021: BUN 24; Creatinine, Ser 0.82; Potassium 4.0; Sodium 133 02/01/2022: ALT 22   Recent Lipid Panel Lab Results  Component Value Date/Time   CHOL 292 (H) 02/01/2022 07:27 AM   TRIG 1,085 (HH) 02/01/2022 07:27 AM   HDL 37 (L) 02/01/2022 07:27 AM   LDLCALC Comment (A) 02/01/2022 07:27 AM    Risk Assessment/Calculations:          Physical Exam:    VS:  BP (!) 125/95   Pulse 65   Ht 5\' 4"  (1.626 m)   Wt 151 lb 9.6 oz (68.8 kg)   SpO2 96%   BMI 26.02 kg/m     HYPERTENSION CONTROL Vitals:   10/02/22 1451 10/02/22 1502  BP: (!) 120/92 (!) 125/95    The patient's blood pressure is elevated above target today.  In order to address the patient's elevated BP: A current anti-hypertensive medication was adjusted today.       Wt Readings from Last 3 Encounters:  10/02/22 151 lb 9.6 oz (68.8 kg)  03/24/22 158 lb (71.7 kg)  12/15/21 157 lb (71.2 kg)    GENERAL:  No apparent distress, AOx3 HEENT:  No carotid  bruits, +2 carotid impulses, no scleral icterus CAR: RRR no murmurs, gallops, rubs, or thrills RES:  Clear to auscultation bilaterally ABD:  Soft, nontender, nondistended, positive bowel sounds x 4 VASC:  +2 radial pulses, +2 carotid pulses, palpable pedal pulses NEURO:  CN 2-12 grossly intact; motor and sensory grossly intact PSYCH:  No active depression or anxiety EXT:  No edema, ecchymosis, or cyanosis  Signed, 12/17/21, MD  10/02/2022 3:12 PM    [facility] [address] [place] [address] [place], [place], [place]  [place] Phone: [phone]; Fax: [phone]   Note:  This document was prepared using Dragon voice recognition software and may include unintentional dictation errors.

## 2022-10-02 ENCOUNTER — Ambulatory Visit: Payer: Self-pay | Attending: Internal Medicine | Admitting: Internal Medicine

## 2022-10-02 ENCOUNTER — Encounter: Payer: Self-pay | Admitting: Internal Medicine

## 2022-10-02 VITALS — BP 125/95 | HR 65 | Ht 64.0 in | Wt 151.6 lb

## 2022-10-02 DIAGNOSIS — E1169 Type 2 diabetes mellitus with other specified complication: Secondary | ICD-10-CM

## 2022-10-02 DIAGNOSIS — E785 Hyperlipidemia, unspecified: Secondary | ICD-10-CM

## 2022-10-02 DIAGNOSIS — I152 Hypertension secondary to endocrine disorders: Secondary | ICD-10-CM

## 2022-10-02 DIAGNOSIS — R072 Precordial pain: Secondary | ICD-10-CM

## 2022-10-02 DIAGNOSIS — E119 Type 2 diabetes mellitus without complications: Secondary | ICD-10-CM

## 2022-10-02 DIAGNOSIS — E1159 Type 2 diabetes mellitus with other circulatory complications: Secondary | ICD-10-CM

## 2022-10-02 MED ORDER — LOSARTAN POTASSIUM 25 MG PO TABS: 25.0000 mg | ORAL_TABLET | Freq: Every day | ORAL | 3 refills | Status: AC

## 2022-10-02 NOTE — Patient Instructions (Addendum)
Medication Instructions:  Your physician has recommended you make the following change in your medication:   Stop taking Imdur/isosorbide Stop taking metoprolol/Toprol  Increase Losartan to 25 mg daily   *If you need a refill on your cardiac medications before your next appointment, please call your pharmacy*   Lab Work: Direct LDL, Lipid panel, Lpa, HFP, Hem AIC  If you have labs (blood work) drawn today and your tests are completely normal, you will receive your results only by: MyChart Message (if you have MyChart) OR A paper copy in the mail If you have any lab test that is abnormal or we need to change your treatment, we will call you to review the results.   Follow-Up: At [facility], you and your health needs are our priority.  As part of our continuing mission to provide you with exceptional heart care, we have created designated Provider Care Teams.  These Care Teams include your primary Cardiologist (physician) and Advanced Practice Providers (APPs -  Physician Assistants and Nurse Practitioners) who all work together to provide you with the care you need, when you need it.  We recommend signing up for the patient portal called "MyChart".  Sign up information is provided on this After Visit Summary.  MyChart is used to connect with patients for Virtual Visits (Telemedicine).  Patients are able to view lab/test results, encounter notes, upcoming appointments, etc.  Non-urgent messages can be sent to your provider as well.   To learn more about what you can do with MyChart, go to [email or web address].    Your next appointment:   9 month(s)  The format for your next appointment:   In Person  Provider:  Dr Lynnette Caffey

## 2022-10-05 ENCOUNTER — Telehealth: Payer: Self-pay | Admitting: *Deleted

## 2022-10-05 DIAGNOSIS — E1165 Type 2 diabetes mellitus with hyperglycemia: Secondary | ICD-10-CM

## 2022-10-05 LAB — HEMOGLOBIN A1C
Est. average glucose Bld gHb Est-mCnc: 369 mg/dL
Hgb A1c MFr Bld: 14.5 % — ABNORMAL HIGH (ref 4.8–5.6)

## 2022-10-05 LAB — LIPID PANEL
Chol/HDL Ratio: 8.1 ratio — ABNORMAL HIGH (ref 0.0–5.0)
Cholesterol, Total: 342 mg/dL — ABNORMAL HIGH (ref 100–199)
HDL: 42 mg/dL (ref >39)
Triglycerides: 968 mg/dL (ref 0–149)

## 2022-10-05 LAB — HEPATIC FUNCTION PANEL
ALT: 15 IU/L (ref 0–44)
AST: 11 IU/L (ref 0–40)
Albumin: 4.4 g/dL (ref 4.1–5.1)
Alkaline Phosphatase: 132 IU/L — ABNORMAL HIGH (ref 44–121)
Bilirubin Total: 0.3 mg/dL (ref 0.0–1.2)
Bilirubin, Direct: 0.11 mg/dL (ref 0.00–0.40)
Total Protein: 7.4 g/dL (ref 6.0–8.5)

## 2022-10-05 LAB — LIPOPROTEIN A (LPA): Lipoprotein (a): 84.7 nmol/L — ABNORMAL HIGH (ref <75.0)

## 2022-10-05 LAB — LDL CHOLESTEROL, DIRECT: LDL Direct: 163 mg/dL — ABNORMAL HIGH (ref 0–99)

## 2022-10-05 NOTE — Telephone Encounter (Addendum)
-----   Message from [name], MD sent at 10/04/2022  8:32 AM EST ----- Please refer to pharmacy for DM and lipid management.   [name], MD 10/05/2022  7:24 AM EST     Let patient know that his diabetes is not under good control.  Please forward this result to his PCP.  Please refer to endocrinology for diabetic management.    Routed to PCP, endocrine referral placed.

## 2022-10-09 NOTE — Telephone Encounter (Signed)
Left message for [name] or patient to call back to discuss recent lab results.

## 2022-11-03 ENCOUNTER — Encounter: Payer: Self-pay | Admitting: *Deleted

## 2022-11-03 NOTE — Telephone Encounter (Signed)
Letter sent to call office for results/recommendations.

## 2022-12-19 ENCOUNTER — Other Ambulatory Visit: Payer: Self-pay | Admitting: Internal Medicine

## 2023-02-17 ENCOUNTER — Other Ambulatory Visit: Payer: Self-pay | Admitting: Internal Medicine

## 2023-02-19 NOTE — Telephone Encounter (Signed)
Rx request sent to pharmacy.  

## 2023-07-30 NOTE — Progress Notes (Deleted)
Cardiology Office Note    Date:  07/30/2023  ID:  Darrell Turner, DOB 1971-08-02, MRN [id number] PCP:  [facility]  Cardiologist:  [name], MD  Electrophysiologist:  None   Chief Complaint: Follow up regarding chest pain and hyperlipidemia  History of Present Illness: .    Darrell Turner is a 52 y.o. male with visit-pertinent history of normal coronary CTA in 03/2022, diabetes mellitus type 2, hypertension and hyperlipidemia.  He was seen by Dr. Lynnette Caffey in 2023 for chest pain with coronary CTA showing coronary calcium score of 0, evidence of prior granulomatous modest disease.  Previously intended to be referred to [facility] for lipid management.  Chest pain: Seen in 2023 for chest pain, coronary CTA showed coronary calcium score of 0. Heart healthy diet and regular cardiovascular exercise encouraged.   Continue   Dyslipidemia: Last lipid profile on 10/02/2022 indicated total cholesterol 342, HDL 42, direct LDL 163, lipoprotein a 84.7 and triglycerides 968.  Will repeat fasting lipid profile and direct LDL.  Refer to Dr. Rennis Golden for further evaluation.  Uncontrolled DM: Hemoglobin A1c 14.5 on 10/02/2022.  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  Recommended that he follow-up with his PCP for ongoing monitoring and management.  Labwork independently reviewed: 10/02/2022 indicated total cholesterol 342, HDL 42, direct LDL 163, lipoprotein a 84.7 and triglycerides 968.   ROS: .    *** Today he denies chest pain, shortness of breath, lower extremity edema, fatigue, palpitations, melena, hematuria, hemoptysis, diaphoresis, weakness, presyncope, syncope, orthopnea, and PND.  All other systems are reviewed and otherwise negative.  Studies Reviewed: Marland Kitchen    EKG:  EKG is ordered today, personally reviewed, demonstrating ***     CV Studies:  Cardiac Studies & Procedures          CT SCANS  CT CORONARY MORPH W/CTA COR  W/SCORE 04/17/2022  Addendum 04/17/2022  4:54 PM ADDENDUM REPORT: 04/17/2022 16:52  HISTORY: 52 yo male with angina pectoris, risk stratification  EXAM: Cardiac/Coronary CTA  TECHNIQUE: The patient was scanned on a [employer].  PROTOCOL: A 120 kV prospective scan was triggered in the descending thoracic aorta at 111 HU's. Axial non-contrast 3 mm slices were carried out through the heart. The data set was analyzed on a dedicated work station and scored using the Agatson method. Gantry rotation speed was 250 msecs and collimation was .6 mm. Beta blockade and 0.8 mg of sl NTG was given. The 3D data set was reconstructed in 5% intervals of the 35-75 % of the R-R cycle. Diastolic phases were analyzed on a dedicated work station using MPR, MIP and VRT modes. The patient received 98mL OMNIPAQUE IOHEXOL 350 MG/ML SOLN of contrast.  FINDINGS: Quality: Good, HR 80, misalignment artifact  Coronary calcium score: The patient's coronary artery calcium score is 0, which places the patient in the 0 percentile.  Coronary arteries: Normal coronary origins.  Right dominance.  Right Coronary Artery: Dominant.  Normal vessel.  Left Main Coronary Artery: Normal. Bifurcates into the LAD and LCx arteries.  Left Anterior Descending Coronary Artery: Large anterior artery with 3 diagonal branches. No disease.  Left Circumflex Artery: AV groove vessel, no disease.  Aorta: Normal size, 28 mm at the mid ascending aorta (level of the PA bifurcation) measured double oblique. No calcifications. No dissection.  Aortic Valve: Trileaflet. No calcifications.  Other findings:  Normal pulmonary vein drainage into the left atrium.  Normal left  atrial appendage without a thrombus.  Normal size of the pulmonary artery.  IMPRESSION: 1. No evidence of CAD, CADRADS = 0.  2. Coronary calcium score of 0. This was 0 percentile for age and sex matched control.  3. Normal coronary origin with  right dominance.  4. Consider non-coronary causes of chest pain   Electronically Signed By: Chrystie Nose M.D. On: 04/17/2022 16:52  Narrative EXAM: OVER-READ INTERPRETATION  CT CHEST  The following report is an over-read performed by radiologist Dr. Irish Lack of [facility] on 04/17/2022. This over-read does not include interpretation of cardiac or coronary anatomy or pathology. The coronary CTA interpretation by the cardiologist is attached.  COMPARISON:  None Available.  FINDINGS: Vascular: No significant noncardiac vascular findings.  Mediastinum/Nodes: Calcified mediastinal and bilateral hilar lymph nodes are consistent with prior granulomatous disease.  Lungs/Pleura: Calcified granuloma of the left upper lobe. Visualized lungs show no evidence of pulmonary edema, consolidation, pneumothorax or pleural fluid.  Upper Abdomen: No acute abnormality.  Musculoskeletal: No chest wall mass or suspicious bone lesions identified.  IMPRESSION: Evidence of prior granulomatous disease.  Electronically Signed: By: Irish Lack M.D. On: 04/17/2022 16:35           Current Reported Medications:.    No outpatient medications have been marked as taking for the 08/01/23 encounter (Appointment) with Laurann Montana, PA-C.   Current Facility-Administered Medications for the 08/01/23 encounter (Appointment) with Laurann Montana, PA-C  Medication   0.9 %  sodium chloride infusion   Physical Exam:    VS:  There were no vitals taken for this visit.   Wt Readings from Last 3 Encounters:  10/02/22 151 lb 9.6 oz (68.8 kg)  03/24/22 158 lb (71.7 kg)  12/15/21 157 lb (71.2 kg)    GEN: Well nourished, well developed in no acute distress NECK: No JVD; No carotid bruits CARDIAC: ***RRR, no murmurs, rubs, gallops RESPIRATORY:  Clear to auscultation without rales, wheezing or rhonchi  ABDOMEN: Soft, non-tender, non-distended EXTREMITIES:  No edema; No acute  deformity   Asessement and Plan:.     ***     Disposition: F/u with ***  Signed, Rip Harbour, NP

## 2023-08-01 ENCOUNTER — Ambulatory Visit: Payer: Self-pay | Attending: Physician Assistant | Admitting: Physician Assistant

## 2023-08-02 ENCOUNTER — Encounter: Payer: Self-pay | Admitting: Physician Assistant

## 2023-08-07 NOTE — Progress Notes (Signed)
Cardiology Office Note:   Date:  08/07/2023  ID:  Darrell Turner, DOB [birth year], MRN [id number] PCP:  [facility]  [facility] Providers Cardiologist:  Alverda Skeans, MD Referring MD: [facility]*  Chief Complaint/Reason for Referral: Cardiology follow-up ASSESSMENT:    PATIENT DID NOT APPEAR FOR APPOINTMENT   1. Poorly controlled diabetes mellitus (HCC)   2. Hypertension associated with diabetes (HCC)   3. Hyperlipidemia associated with type 2 diabetes mellitus (HCC)     PLAN:    PATIENT DID NOT APPEAR FOR APPOINTMENT  In order of problems listed above: Type 2 diabetes: Continue aspirin, Lipitor, Jardiance, Cozaar, and Ozempic. Hypertension: Hyperlipidemia: Check lipid panel and LFTs today.  PATIENT DID NOT APPEAR FOR APPOINTMENT  History of Present Illness:      FOCUSED PROBLEM LIST:   Chest pain Coronary CTA 2023 with calcium score of 0 no obstructive coronary artery disease Type 2 diabetes Hypertension Hyperlipidemia elevated LP(a) Spanish-speaking  February 2023: The patient was seen for initial consultation regarding angina pectoris.  He is scheduled for a screening colonoscopy and EGD which was canceled due to angina.  At that visit he was started on losartan 12.5 mg daily, aspirin 81 mg daily, Jardiance 10 mg daily, atorvastatin 40 mg daily, Toprol-XL 12.5 mg at bedtime, and Imdur at bedtime as well as as needed nitroglycerin.  If his chest pain did not respond to medical therapy cardiac catheterization would be pursued given his high pretest probability of coronary artery disease given his multiple cardiovascular risk factors.  In the interim our office contact the patient and he reported that his chest pain had resolved with medical therapy.  He was referred to pharmacy due to very abnormal lipid panel with hypertriglyceridemia.  He was started on metformin 500 mg twice daily as well as fenofibrate.  Ozempic was also approved.  He has  repeat labs to be drawn in July per pharmacy.  Plan: Start aspirin 81 mg, Toprol-XL 12.5 mg, and Imdur 30 mg for medical treatment of presumed chronic stable angina; change lovastatin to atorvastatin; start losartan 12.5 mg a day and Jardiance 10 mg a day due to type 2 diabetes.   May 2023: The patient is doing very well.  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  He has not required emergency room visits or hospitalizations.  He does not smoke.  He is completely compliant with his medications.  He has not yet started Ozempic per pharmacy.  He denies any bleeding or bruising episodes, signs or symptoms of stroke, orthopnea, or paroxysmal nocturnal dyspnea.  Plan: Refer for coronary CTA and echocardiogram to exclude multivessel disease, left main disease, and LV dysfunction.   December 2023: The patient's coronary CTA demonstrated no obstructive coronary artery disease with a calcium score of 0.  An echocardiogram was not performed.  He is doing well.  He denies any chest pain, shortness of breath, presyncope, syncope, palpitations, paroxysmal nocturnal dyspnea, orthopnea.  He is working with his primary care physician and says that his diabetes is under better control.  Plan: Stop Imdur, Toprol-XL, nitroglycerin; check hemoglobin A1c, direct LDL, LFTs, and LP(a).  October 2024: His hemoglobin A1c was found to be 14.5.  We informed his PCP and refer the patient to endocrinology.  His direct LDL was 163 and he was referred to pharmacy.          Current Medications: No outpatient medications have been marked as taking for the 08/08/23 encounter (  Appointment) with [name], MD.   Current Facility-Administered Medications for the 08/08/23 encounter (Appointment) with [name], MD  Medication   0.9 %  sodium chloride infusion     Review of Systems:   Please see the history of present illness.    All other systems reviewed and are  negative.     EKGs/Labs/Other Test Reviewed:   EKG:    EKG Interpretation Date/Time:    Ventricular Rate:    PR Interval:    QRS Duration:    QT Interval:    QTC Calculation:   R Axis:      Text Interpretation:           Risk Assessment/Calculations:         Physical Exam:    Signed, [name], MD  08/07/2023 12:29 PM    [facility] [address] [place], [place], [place]  [address] Phone: [phone]; Fax: [phone]   Note:  This document was prepared using Dragon voice recognition software and may include unintentional dictation errors.This encounter was created in error - please disregard.

## 2023-08-08 ENCOUNTER — Ambulatory Visit: Payer: Self-pay | Attending: Internal Medicine | Admitting: Internal Medicine

## 2023-08-08 DIAGNOSIS — I152 Hypertension secondary to endocrine disorders: Secondary | ICD-10-CM

## 2023-08-08 DIAGNOSIS — E1165 Type 2 diabetes mellitus with hyperglycemia: Secondary | ICD-10-CM

## 2023-08-08 DIAGNOSIS — E1169 Type 2 diabetes mellitus with other specified complication: Secondary | ICD-10-CM

## 2023-08-09 ENCOUNTER — Encounter: Payer: Self-pay | Admitting: Internal Medicine

## 2023-08-22 NOTE — Progress Notes (Unsigned)
Cardiology Office Note:   Date:  08/23/2023  ID:  Darrell Turner, DOB 12-Jan-1971, MRN [id number] PCP:  [facility]  [facility] Providers Cardiologist:  Alverda Skeans, MD Referring MD: [facility]*  Chief Complaint/Reason for Referral: Cardiology follow-up ASSESSMENT:    1. Precordial pain     PLAN:   In order of problems listed above: Type 2 diabetes: Continue aspirin, Lipitor, Jardiance, Cozaar, and Ozempic. Hypertension: Blood pressure is well-controlled on his current regimen. Hyperlipidemia: Myalgias in response to atorvastatin, will d/c and start Crestor 20mg ; check lipids, LFTs in 2 months.  Patient will let us know if he develops problems with Crestor.  If this is the case we will refer him to pharmacy for further recommendations.            Dispo:  Return in about 6 months (around 02/21/2024).      Medication Adjustments/Labs and Tests Ordered: Current medicines are reviewed at length with the patient today.  Concerns regarding medicines are outlined above.  The following changes have been made:  no change   Labs/tests ordered: Orders Placed This Encounter  Procedures   EKG 12-Lead    Medication Changes: No orders of the defined types were placed in this encounter.   Current medicines are reviewed at length with the patient today.  The patient does not have concerns regarding medicines.  I spent 31 minutes reviewing all clinical data during and prior to this visit including all relevant imaging studies, laboratories, clinical information from other health systems, and prior notes from both Cardiology and other specialties, interviewing the patient, and conducting a complete physical examination in order to formulate a comprehensive and personalized evaluation and treatment plan.  History of Present Illness:      FOCUSED PROBLEM LIST:   Chest pain Coronary CTA 2023 with calcium score of 0 with no obstructive disease  Type 2  diabetes Hypertension Hyperlipidemia elevated LP(a) Myalgias in response to atorvastatin Spanish-speaking  February 2023: The patient was seen for initial consultation regarding angina pectoris.  He is scheduled for a screening colonoscopy and EGD which was canceled due to angina.  At that visit he was started on losartan 12.5 mg daily, aspirin 81 mg daily, Jardiance 10 mg daily, atorvastatin 40 mg daily, Toprol-XL 12.5 mg at bedtime, and Imdur at bedtime as well as as needed nitroglycerin.  If his chest pain did not respond to medical therapy cardiac catheterization would be pursued given his high pretest probability of coronary artery disease given his multiple cardiovascular risk factors.  In the interim our office contact the patient and he reported that his chest pain had resolved with medical therapy.  He was referred to pharmacy due to very abnormal lipid panel with hypertriglyceridemia.  He was started on metformin 500 mg twice daily as well as fenofibrate.  Ozempic was also approved.  He has repeat labs to be drawn in July per pharmacy.  Plan: Start aspirin 81 mg, Toprol-XL 12.5 mg, and Imdur 30 mg for medical treatment of presumed chronic stable angina; change lovastatin to atorvastatin; start losartan 12.5 mg a day and Jardiance 10 mg a day due to type 2 diabetes.   May 2023: The patient is doing very well.  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  He has not required emergency room visits or hospitalizations.  He does not smoke.  He is completely compliant with his medications.  He has not yet started Ozempic per pharmacy.  He denies any bleeding or bruising episodes, signs or symptoms of stroke, orthopnea, or paroxysmal nocturnal dyspnea.  Plan: Refer for coronary CTA and echocardiogram to exclude multivessel disease, left main disease, and LV dysfunction.   December 2023: The patient's coronary CTA demonstrated no obstructive coronary artery  disease with a calcium score of 0.  An echocardiogram was not performed.  He is doing well.  He denies any chest pain, shortness of breath, presyncope, syncope, palpitations, paroxysmal nocturnal dyspnea, orthopnea.  He is working with his primary care physician and says that his diabetes is under better control.  Plan: Stop Imdur, Toprol-XL, nitroglycerin; check hemoglobin A1c, direct LDL, LFTs, and LP(a).  October 2024: His hemoglobin A1c was found to be 14.5.  We informed his PCP and referred the patient to endocrinology.  His direct LDL was 163 and he was referred to pharmacy.  He has lost about 60 pounds on Ozempic.  He feels very well.  He has been started on medications to better control his diabetes.  He tells me that his blood sugars are much better controlled.  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  He has noticed myalgias of his left upper arm and left lower extremity.  He has noticed this since starting atorvastatin.  This does limit his ability to work.  He otherwise denies any chest pain or shortness of breath.  He feels very well compared to when I saw him last time.              Current Medications: Current Meds  Medication Sig   aspirin EC 81 MG tablet Take 1 tablet (81 mg total) by mouth daily. Swallow whole.   atorvastatin (LIPITOR) 40 MG tablet TAKE 1 TABLET(40 MG) BY MOUTH DAILY   esomeprazole (NEXIUM) 40 MG capsule Take 40 mg by mouth daily at 12 noon.   fenofibrate (TRICOR) 145 MG tablet Take 1 tablet (145 mg total) by mouth daily.   JARDIANCE 10 MG TABS tablet TAKE 1 TABLET(10 MG) BY MOUTH DAILY BEFORE BREAKFAST   losartan (COZAAR) 25 MG tablet Take 1 tablet (25 mg total) by mouth daily.   metFORMIN (GLUCOPHAGE) 500 MG tablet Take 1 tablet (500 mg total) by mouth 2 (two) times daily with a meal.   nitroGLYCERIN (NITROSTAT) 0.4 MG SL tablet Place 1 tablet (0.4 mg total) under the tongue every 5 (five) minutes as needed for chest pain.    Semaglutide,0.25 or 0.5MG /DOS, (OZEMPIC, 0.25 OR 0.5 MG/DOSE,) 2 MG/3ML SOPN Inject 0.25 mg into the skin once a week.   Current Facility-Administered Medications for the 08/23/23 encounter (Office Visit) with [name], MD  Medication   0.9 %  sodium chloride infusion     Review of Systems:   Please see the history of present illness.    All other systems reviewed and are negative.     EKGs/Labs/Other Test Reviewed:   EKG: EKG performed February 2023 that I reviewed demonstrates sinus rhythm and T wave inversion in lead III  EKG Interpretation Date/Time:  Thursday August 23 2023 09:14:13 EDT Ventricular Rate:  71 PR Interval:  134 QRS Duration:  88 QT Interval:  388 QTC Calculation: 421 R Axis:   71  Text Interpretation: Normal sinus rhythm Abnormal QRS-T angle, consider primary T wave abnormality No previous ECGs available Confirmed by Alverda Skeans ([id number]) on 08/23/2023 9:17:04 AM         Risk Assessment/Calculations:          Physical  Exam:   VS:  BP 110/78   Pulse 71   Ht 5\' 4"  (1.626 m)   Wt 143 lb 3.2 oz (65 kg)   SpO2 96%   BMI 24.58 kg/m        Wt Readings from Last 3 Encounters:  08/23/23 143 lb 3.2 oz (65 kg)  10/02/22 151 lb 9.6 oz (68.8 kg)  03/24/22 158 lb (71.7 kg)      GENERAL:  No apparent distress, AOx3 HEENT:  No carotid bruits, +2 carotid impulses, no scleral icterus CAR: RRR no murmurs, gallops, rubs, or thrills RES:  Clear to auscultation bilaterally ABD:  Soft, nontender, nondistended, positive bowel sounds x 4 VASC:  +2 radial pulses, +2 carotid pulses NEURO:  CN 2-12 grossly intact; motor and sensory grossly intact PSYCH:  No active depression or anxiety EXT:  No edema, ecchymosis, or cyanosis  Signed, [name], MD  08/23/2023 9:29 AM    [facility] [address] [place], [place], [place]  [address] Phone: [phone]; Fax: [phone]   Note:  This document was prepared using Dragon voice  recognition software and may include unintentional dictation errors.

## 2023-08-23 ENCOUNTER — Encounter: Payer: Self-pay | Admitting: Internal Medicine

## 2023-08-23 ENCOUNTER — Ambulatory Visit: Payer: No Typology Code available for payment source | Attending: Internal Medicine | Admitting: Internal Medicine

## 2023-08-23 ENCOUNTER — Other Ambulatory Visit: Payer: Self-pay | Admitting: *Deleted

## 2023-08-23 VITALS — BP 110/78 | HR 71 | Ht 64.0 in | Wt 143.2 lb

## 2023-08-23 DIAGNOSIS — R072 Precordial pain: Secondary | ICD-10-CM

## 2023-08-23 DIAGNOSIS — E782 Mixed hyperlipidemia: Secondary | ICD-10-CM

## 2023-08-23 MED ORDER — ROSUVASTATIN CALCIUM 20 MG PO TABS: 20.0000 mg | ORAL_TABLET | Freq: Every day | ORAL | 3 refills | Status: AC

## 2023-08-23 NOTE — Patient Instructions (Signed)
Medication Instructions:  Your physician has recommended you make the following change in your medication: Stop Atorvastatin. Start Rosuvastatin 20 mg by mouth daily  *If you need a refill on your cardiac medications before your next appointment, please call your pharmacy*   Lab Work: Your physician recommends that you return for lab work on December 27--Lipid and liver profiles.  The lab opens at 7:30 AM and is closed from 12:30-1:30.   The lab closes at 4:15 PM  If you have labs (blood work) drawn today and your tests are completely normal, you will receive your results only by: MyChart Message (if you have MyChart) OR A paper copy in the mail If you have any lab test that is abnormal or we need to change your treatment, we will call you to review the results.   Testing/Procedures: none   Follow-Up: At [facility], you and your health needs are our priority.  As part of our continuing mission to provide you with exceptional heart care, we have created designated Provider Care Teams.  These Care Teams include your primary Cardiologist (physician) and Advanced Practice Providers (APPs -  Physician Assistants and Nurse Practitioners) who all work together to provide you with the care you need, when you need it.  We recommend signing up for the patient portal called "MyChart".  Sign up information is provided on this After Visit Summary.  MyChart is used to connect with patients for Virtual Visits (Telemedicine).  Patients are able to view lab/test results, encounter notes, upcoming appointments, etc.  Non-urgent messages can be sent to your provider as well.   To learn more about what you can do with MyChart, go to [email or web address].    Your next appointment:   6 month(s)  Provider:   Jari Favre, PA-C, Ronie Spies, PA-C, Robin Searing, NP, Jacolyn Reedy, PA-C, Eligha Bridegroom, NP, Tereso Newcomer, PA-C, or Perlie Gold, PA-C         Other Instructions

## 2023-08-23 NOTE — Addendum Note (Signed)
Addended by: Dossie Arbour on: 08/23/2023 09:50 AM   Modules accepted: Orders
